# Patient Record
Sex: Female | Born: 1975 | Race: Black or African American | Hispanic: No | Marital: Married | Smoking: Never smoker
Health system: Southern US, Community
[De-identification: ages and names within clinical notes are randomized; demographics above are authoritative.]

## PROBLEM LIST (undated history)

## (undated) ENCOUNTER — Inpatient Hospital Stay (HOSPITAL_COMMUNITY): Payer: Self-pay

## (undated) DIAGNOSIS — N83209 Unspecified ovarian cyst, unspecified side: Secondary | ICD-10-CM

## (undated) DIAGNOSIS — R51 Headache: Secondary | ICD-10-CM

## (undated) DIAGNOSIS — R87619 Unspecified abnormal cytological findings in specimens from cervix uteri: Secondary | ICD-10-CM

## (undated) DIAGNOSIS — IMO0002 Reserved for concepts with insufficient information to code with codable children: Secondary | ICD-10-CM

## (undated) DIAGNOSIS — E236 Other disorders of pituitary gland: Secondary | ICD-10-CM

## (undated) HISTORY — PX: EYE SURGERY: SHX253

---

## 2010-12-31 ENCOUNTER — Other Ambulatory Visit: Payer: Self-pay | Admitting: Obstetrics and Gynecology

## 2011-01-03 ENCOUNTER — Ambulatory Visit
Admission: RE | Admit: 2011-01-03 | Discharge: 2011-01-03 | Disposition: A | Discharge status: Home / Self Care | Payer: PRIVATE HEALTH INSURANCE | Source: Ambulatory Visit | Attending: Obstetrics and Gynecology | Admitting: Obstetrics and Gynecology

## 2011-01-03 MED ORDER — GADOBENATE DIMEGLUMINE 529 MG/ML IV SOLN
7.0000 mL | Freq: Once | INTRAVENOUS | Status: AC | PRN
  Administered 2011-01-03: 7 mL via INTRAVENOUS

## 2011-06-04 NOTE — L&D Delivery Note (Signed)
Patient was C/C/+2 and pushed for 20 minutes with epidural.   NSVD female infant, Apgars 9/9, weight pending.   The patient had a second degree lacerations that extended very superficially to anus, but by digital exam confirmed no internal tearing of mucosa, repaired with 2-0 vicryl.  Also right labial tear - one figure of 8 with 2-0 vicryl. Fundus was firm. EBL was expected. Placenta was delivered intact. Vagina was clear.  Baby was vigorous to bedside.  Wendy Mercer

## 2011-10-09 LAB — OB RESULTS CONSOLE HIV ANTIBODY (ROUTINE TESTING): HIV: NONREACTIVE

## 2011-10-09 LAB — OB RESULTS CONSOLE ABO/RH: RH Type: POSITIVE

## 2011-10-09 LAB — OB RESULTS CONSOLE HEPATITIS B SURFACE ANTIGEN: Hepatitis B Surface Ag: NEGATIVE

## 2011-10-09 LAB — OB RESULTS CONSOLE RPR: RPR: NONREACTIVE

## 2012-03-29 ENCOUNTER — Inpatient Hospital Stay (HOSPITAL_COMMUNITY)
Admission: AD | Admit: 2012-03-29 | Discharge: 2012-03-29 | Disposition: A | Discharge status: Home / Self Care | Payer: PRIVATE HEALTH INSURANCE | Source: Ambulatory Visit | Attending: Obstetrics and Gynecology | Admitting: Obstetrics and Gynecology

## 2012-03-29 ENCOUNTER — Encounter (HOSPITAL_COMMUNITY): Payer: Self-pay | Admitting: *Deleted

## 2012-03-29 DIAGNOSIS — O4690 Antepartum hemorrhage, unspecified, unspecified trimester: Secondary | ICD-10-CM | POA: Insufficient documentation

## 2012-03-29 DIAGNOSIS — Z3689 Encounter for other specified antenatal screening: Secondary | ICD-10-CM

## 2012-03-29 DIAGNOSIS — Z36 Encounter for antenatal screening of mother: Secondary | ICD-10-CM

## 2012-03-29 DIAGNOSIS — O368190 Decreased fetal movements, unspecified trimester, not applicable or unspecified: Secondary | ICD-10-CM | POA: Insufficient documentation

## 2012-03-29 HISTORY — DX: Unspecified abnormal cytological findings in specimens from cervix uteri: R87.619

## 2012-03-29 HISTORY — DX: Reserved for concepts with insufficient information to code with codable children: IMO0002

## 2012-03-29 HISTORY — DX: Unspecified ovarian cyst, unspecified side: N83.209

## 2012-03-29 NOTE — MAU Note (Signed)
Pt reports she has not felt her baby move as much today as normal. Also had sme spotting when she went to the BR . Denies any pain at this time. Having some pressure that is not new (has had for the past week or 2)

## 2012-03-29 NOTE — MAU Provider Note (Signed)
History     CSN: [id number]  Arrival date & time 03/29/12  1217   None     Chief Complaint  Patient presents with  . Decreased Fetal Movement  . Vaginal Bleeding    (Consider location/radiation/quality/duration/timing/severity/associated sxs/prior treatment) HPI Wendy Mercer is a 36 y.o. G1P0 at [redacted]w[redacted]d . She noticed decrease fetal movement today, had dark spotting x 1 this am.  Has occ contractions, not a change for her. She is dressed and ready to leave, her monitor strip was good, she is reassured.  Past Medical History  Diagnosis Date  . Abnormal Pap smear   . Ovarian cyst     Past Surgical History  Procedure Date  . Eye surgery     lasix    No family history on file.  History  Substance Use Topics  . Smoking status: Never Smoker   . Smokeless tobacco: Not on file  . Alcohol Use: No    OB History    Grav Para Term Preterm Abortions TAB SAB Ect Mult Living   1               Review of Systems  Constitutional: Negative for fever and chills.  Genitourinary: Positive for vaginal bleeding. Negative for vaginal discharge and vaginal pain.    Allergies  Review of patient's allergies indicates no known allergies.  Home Medications  No current outpatient prescriptions on file.  BP 123/75  Pulse 80  Temp 98.4 F (36.9 C) (Oral)  Resp 18  Ht 5\' 4"  (1.626 m)  Wt 154 lb 6.4 oz (70.035 kg)  BMI 26.50 kg/m2  Physical Exam  Constitutional: She is oriented to person, place, and time. She appears well-developed and well-nourished.  Musculoskeletal: Normal range of motion.  Neurological: She is alert and oriented to person, place, and time.  Skin: Skin is warm and dry.  Psychiatric: She has a normal mood and affect. Her behavior is normal.    ED Course  Procedures (including critical care time)  Labs Reviewed - No data to display No results found. ASSESSMENT: Reactive strip, no regular contractions Dr Dareen Piano informed of pt status  No diagnosis  found. PLAN:  May may be discharge, kick counts Next OB visit is 11/1. Pt to call the office with other changes or concerns.    MDM

## 2012-04-17 ENCOUNTER — Encounter (HOSPITAL_COMMUNITY): Payer: Self-pay

## 2012-04-17 ENCOUNTER — Encounter (HOSPITAL_COMMUNITY): Payer: Self-pay | Admitting: Anesthesiology

## 2012-04-17 ENCOUNTER — Inpatient Hospital Stay (HOSPITAL_COMMUNITY): Payer: PRIVATE HEALTH INSURANCE | Admitting: Anesthesiology

## 2012-04-17 ENCOUNTER — Inpatient Hospital Stay (HOSPITAL_COMMUNITY)
Admission: AD | Admit: 2012-04-17 | Discharge: 2012-04-20 | DRG: 775 | Disposition: A | Discharge status: Home / Self Care | Payer: PRIVATE HEALTH INSURANCE | Source: Ambulatory Visit | Attending: Obstetrics and Gynecology | Admitting: Obstetrics and Gynecology

## 2012-04-17 LAB — CBC
MCV: 87.4 fL (ref 78.0–100.0)
Platelets: 270 10*3/uL (ref 150–400)
RBC: 4.27 MIL/uL (ref 3.87–5.11)
RDW: 12.9 % (ref 11.5–15.5)
WBC: 10.8 10*3/uL — ABNORMAL HIGH (ref 4.0–10.5)

## 2012-04-17 MED ORDER — DIPHENHYDRAMINE HCL 50 MG/ML IJ SOLN: 12.5000 mg | INTRAMUSCULAR | Status: DC | PRN

## 2012-04-17 MED ORDER — IBUPROFEN 600 MG PO TABS: 600.0000 mg | ORAL_TABLET | Freq: Four times a day (QID) | ORAL | Status: DC | PRN

## 2012-04-17 MED ORDER — FENTANYL 2.5 MCG/ML BUPIVACAINE 1/10 % EPIDURAL INFUSION (WH - ANES)
14.0000 mL/h | INTRAMUSCULAR | Status: DC
  Filled 2012-04-17: qty 125

## 2012-04-17 MED ORDER — EPHEDRINE 5 MG/ML INJ
10.0000 mg | INTRAVENOUS | Status: DC | PRN
  Administered 2012-04-17: 10 mg via INTRAVENOUS
  Filled 2012-04-17: qty 4

## 2012-04-17 MED ORDER — SODIUM BICARBONATE 8.4 % IV SOLN
INTRAVENOUS | Status: DC | PRN
  Administered 2012-04-17: 5 mL via EPIDURAL

## 2012-04-17 MED ORDER — PHENYLEPHRINE 40 MCG/ML (10ML) SYRINGE FOR IV PUSH (FOR BLOOD PRESSURE SUPPORT)
80.0000 ug | PREFILLED_SYRINGE | INTRAVENOUS | Status: DC | PRN
  Filled 2012-04-17: qty 5

## 2012-04-17 MED ORDER — EPHEDRINE 5 MG/ML INJ: 10.0000 mg | INTRAVENOUS | Status: DC | PRN

## 2012-04-17 MED ORDER — ACETAMINOPHEN 325 MG PO TABS: 650.0000 mg | ORAL_TABLET | ORAL | Status: DC | PRN

## 2012-04-17 MED ORDER — CITRIC ACID-SODIUM CITRATE 334-500 MG/5ML PO SOLN: 30.0000 mL | ORAL | Status: DC | PRN

## 2012-04-17 MED ORDER — ONDANSETRON HCL 4 MG/2ML IJ SOLN: 4.0000 mg | Freq: Four times a day (QID) | INTRAMUSCULAR | Status: DC | PRN

## 2012-04-17 MED ORDER — LACTATED RINGERS IV SOLN
INTRAVENOUS | Status: DC
  Administered 2012-04-17: 22:00:00 via INTRAVENOUS

## 2012-04-17 MED ORDER — LIDOCAINE HCL (PF) 1 % IJ SOLN
30.0000 mL | INTRAMUSCULAR | Status: DC | PRN
  Filled 2012-04-17: qty 30

## 2012-04-17 MED ORDER — OXYCODONE-ACETAMINOPHEN 5-325 MG PO TABS: 1.0000 | ORAL_TABLET | ORAL | Status: DC | PRN

## 2012-04-17 MED ORDER — OXYTOCIN BOLUS FROM INFUSION: 500.0000 mL | INTRAVENOUS | Status: DC

## 2012-04-17 MED ORDER — LACTATED RINGERS IV SOLN
500.0000 mL | Freq: Once | INTRAVENOUS | Status: AC
  Administered 2012-04-17: 500 mL via INTRAVENOUS

## 2012-04-17 MED ORDER — PHENYLEPHRINE 40 MCG/ML (10ML) SYRINGE FOR IV PUSH (FOR BLOOD PRESSURE SUPPORT): 80.0000 ug | PREFILLED_SYRINGE | INTRAVENOUS | Status: DC | PRN

## 2012-04-17 MED ORDER — LACTATED RINGERS IV SOLN
500.0000 mL | INTRAVENOUS | Status: DC | PRN
  Administered 2012-04-17: 300 mL via INTRAVENOUS

## 2012-04-17 MED ORDER — FENTANYL 2.5 MCG/ML BUPIVACAINE 1/10 % EPIDURAL INFUSION (WH - ANES)
INTRAMUSCULAR | Status: DC | PRN
  Administered 2012-04-17: 14 mL/h via EPIDURAL

## 2012-04-17 MED ORDER — OXYTOCIN 40 UNITS IN LACTATED RINGERS INFUSION - SIMPLE MED
62.5000 mL/h | INTRAVENOUS | Status: DC
  Administered 2012-04-18: 999 mL/h via INTRAVENOUS
  Filled 2012-04-17: qty 1000

## 2012-04-17 NOTE — H&P (Signed)
36 y.o. [redacted]w[redacted]d  G1P0 comes in c/o ctx, checked in office this week - 2 cm, now 5cm.  Otherwise has good fetal movement and no bleeding.  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  Past Medical History  Diagnosis Date  . Abnormal Pap smear   . Ovarian cyst     Past Surgical History  Procedure Date  . Eye surgery     lasix    OB History    Grav Para Term Preterm Abortions TAB SAB Ect Mult Living   1              # Outc Date GA Lbr Len/2nd Wgt Sex Del Anes PTL Lv   1 CUR               History   Social History  . Marital Status: Married    Spouse Name: N/A    Number of Children: N/A  . Years of Education: N/A   Occupational History  . Not on file.   Social History Main Topics  . Smoking status: Never Smoker   . Smokeless tobacco: Not on file  . Alcohol Use: No  . Drug Use: No  . Sexually Active: Yes   Other Topics Concern  . Not on file   Social History Narrative  . No narrative on file   Review of patient's allergies indicates no known allergies.    Prenatal Transfer Tool  Maternal Diabetes: No Genetic Screening: Declined Maternal Ultrasounds/Referrals: Normal Fetal Ultrasounds or other Referrals:  None Maternal Substance Abuse:  No Significant Maternal Medications:  None Significant Maternal Lab Results: None  Other PNC:    Filed Vitals:   04/17/12 2206  BP: 120/82  Pulse: 106  Temp: 98.1 F (36.7 C)  Resp: 18     Lungs/Cor:  NAD Abdomen:  soft, gravid Ex:  no cords, erythema SVE:  5/70/-2 at admission, currently 6cm FHTs:  140, good STV, NST R Toco:  q 2-3   A/P   Admit to L&D Epidural if desired AROM - blood tinged Other routine care  GBS Neg  Bertin Inabinet

## 2012-04-17 NOTE — Anesthesia Procedure Notes (Signed)

## 2012-04-17 NOTE — Anesthesia Preprocedure Evaluation (Addendum)

## 2012-04-17 NOTE — MAU Note (Signed)
Contractions all day, brownish discharge, no problems with pregnancy

## 2012-04-18 ENCOUNTER — Encounter (HOSPITAL_COMMUNITY): Payer: Self-pay | Admitting: *Deleted

## 2012-04-18 LAB — CBC
HCT: 35.4 % — ABNORMAL LOW (ref 36.0–46.0)
MCH: 31.5 pg (ref 26.0–34.0)
MCHC: 36.2 g/dL — ABNORMAL HIGH (ref 30.0–36.0)
MCV: 87.2 fL (ref 78.0–100.0)
RDW: 12.8 % (ref 11.5–15.5)

## 2012-04-18 LAB — RPR: RPR Ser Ql: NONREACTIVE

## 2012-04-18 MED ORDER — ONDANSETRON HCL 4 MG PO TABS
4.0000 mg | ORAL_TABLET | ORAL | Status: DC | PRN
Start: 2012-04-18 — End: 2012-04-20

## 2012-04-18 MED ORDER — OXYCODONE-ACETAMINOPHEN 5-325 MG PO TABS
1.0000 | ORAL_TABLET | ORAL | Status: DC | PRN
  Administered 2012-04-18 – 2012-04-20 (×5): 1 via ORAL
  Filled 2012-04-18 (×6): qty 1

## 2012-04-18 MED ORDER — PRENATAL MULTIVITAMIN CH
1.0000 | ORAL_TABLET | Freq: Every day | ORAL | Status: DC
  Administered 2012-04-18 – 2012-04-19 (×2): 1 via ORAL
  Filled 2012-04-18 (×2): qty 1

## 2012-04-18 MED ORDER — IBUPROFEN 600 MG PO TABS
600.0000 mg | ORAL_TABLET | Freq: Four times a day (QID) | ORAL | Status: DC
  Administered 2012-04-18 – 2012-04-20 (×9): 600 mg via ORAL
  Filled 2012-04-18 (×10): qty 1

## 2012-04-18 MED ORDER — ZOLPIDEM TARTRATE 5 MG PO TABS: 5.0000 mg | ORAL_TABLET | Freq: Every evening | ORAL | Status: DC | PRN

## 2012-04-18 MED ORDER — WITCH HAZEL-GLYCERIN EX PADS: 1.0000 "application " | MEDICATED_PAD | CUTANEOUS | Status: DC | PRN

## 2012-04-18 MED ORDER — SENNOSIDES-DOCUSATE SODIUM 8.6-50 MG PO TABS
2.0000 | ORAL_TABLET | Freq: Every day | ORAL | Status: DC
  Administered 2012-04-18 – 2012-04-19 (×2): 2 via ORAL

## 2012-04-18 MED ORDER — DIPHENHYDRAMINE HCL 25 MG PO CAPS: 25.0000 mg | ORAL_CAPSULE | Freq: Four times a day (QID) | ORAL | Status: DC | PRN

## 2012-04-18 MED ORDER — LANOLIN HYDROUS EX OINT: TOPICAL_OINTMENT | CUTANEOUS | Status: DC | PRN

## 2012-04-18 MED ORDER — ONDANSETRON HCL 4 MG/2ML IJ SOLN: 4.0000 mg | INTRAMUSCULAR | Status: DC | PRN

## 2012-04-18 MED ORDER — DIBUCAINE 1 % RE OINT
1.0000 "application " | TOPICAL_OINTMENT | RECTAL | Status: DC | PRN
  Administered 2012-04-20: 1 via RECTAL
  Filled 2012-04-18: qty 28

## 2012-04-18 MED ORDER — BENZOCAINE-MENTHOL 20-0.5 % EX AERO
1.0000 "application " | INHALATION_SPRAY | CUTANEOUS | Status: DC | PRN
  Administered 2012-04-18 – 2012-04-19 (×2): 1 via TOPICAL
  Filled 2012-04-18 (×2): qty 56

## 2012-04-18 MED ORDER — TETANUS-DIPHTH-ACELL PERTUSSIS 5-2.5-18.5 LF-MCG/0.5 IM SUSP: 0.5000 mL | Freq: Once | INTRAMUSCULAR | Status: DC

## 2012-04-18 MED ORDER — SIMETHICONE 80 MG PO CHEW: 80.0000 mg | CHEWABLE_TABLET | ORAL | Status: DC | PRN

## 2012-04-18 NOTE — Progress Notes (Signed)
Patient is eating, ambulating, voiding.  Pain control is good.  Filed Vitals:   04/18/12 0216 04/18/12 0235 04/18/12 0330 04/18/12 0735  BP: 129/77 112/77 129/86 113/71  Pulse: 108 106 106 90  Temp:  98.6 F (37 C) 98.6 F (37 C) 98.5 F (36.9 C)  TempSrc:  Oral Oral Oral  Resp: 18 18 18 18   Height:      Weight:        Fundus firm No CT  Lab Results  Component Value Date   WBC 14.2* 04/18/2012   HGB 12.8 04/18/2012   HCT 35.4* 04/18/2012   MCV 87.2 04/18/2012   PLT 217 04/18/2012    --/--/O POS (11/16 1610)  A/P Post partum day 0 - just after midnight.  Routine care.    Philip Aspen

## 2012-04-19 NOTE — Progress Notes (Signed)
Patient is eating, ambulating, voiding.  Pain control is good.  Appropriate lochia, no complaints.  Filed Vitals:   04/18/12 0735 04/18/12 1423 04/18/12 2156 04/19/12 0650  BP: 113/71 98/61 108/69 95/63  Pulse: 90 99 103 88  Temp: 98.5 F (36.9 C) 98.5 F (36.9 C) 98.8 F (37.1 C) 97.9 F (36.6 C)  TempSrc: Oral Oral Oral Oral  Resp: 18 18 18 18   Height:      Weight:        Fundus firm No CT  Lab Results  Component Value Date   WBC 14.2* 04/18/2012   HGB 12.8 04/18/2012   HCT 35.4* 04/18/2012   MCV 87.2 04/18/2012   PLT 217 04/18/2012    --/--/O POS (11/16 1610)  A/P Post partum day 1. Prolactinoma - f/u with endocrine  Routine care.  Expect d/c 11/18.    Philip Aspen

## 2012-04-19 NOTE — Anesthesia Postprocedure Evaluation (Signed)
  Anesthesia Post-op Note  Patient: Wendy Mercer  Procedure(s) Performed: * No procedures listed *  Patient Location: Mother/Baby  Anesthesia Type:Epidural  Level of Consciousness: awake, alert  and oriented  Airway and Oxygen Therapy: Patient Spontanous Breathing  Post-op Pain: mild  Post-op Assessment: Patient's Cardiovascular Status Stable, Respiratory Function Stable, Patent Airway, No signs of Nausea or vomiting and Pain level controlled  Post-op Vital Signs: stable  Complications: No apparent anesthesia complications

## 2012-04-20 MED ORDER — IBUPROFEN 600 MG PO TABS: 600.0000 mg | ORAL_TABLET | Freq: Four times a day (QID) | ORAL | Status: DC

## 2012-04-20 MED ORDER — OXYCODONE-ACETAMINOPHEN 5-325 MG PO TABS: 1.0000 | ORAL_TABLET | ORAL | Status: DC | PRN

## 2012-04-20 NOTE — Discharge Summary (Signed)
Obstetric Discharge Summary Reason for Admission: onset of labor Prenatal Procedures: ultrasound Intrapartum Procedures: spontaneous vaginal delivery Postpartum Procedures: none Complications-Operative and Postpartum: 2nd degree perineal laceration, right labial laceration. Hemoglobin  Date Value Range Status  04/18/2012 12.8  12.0 - 15.0 g/dL Final     HCT  Date Value Range Status  04/18/2012 35.4* 36.0 - 46.0 % Final    Physical Exam:  General: alert Lochia: appropriate Uterine Fundus: firm  Discharge Diagnoses: Term Pregnancy-delivered  Discharge Information: Date: 04/20/2012 Activity: pelvic rest Diet: routine Medications: PNV, Ibuprofen and Percocet Condition: stable Instructions: refer to practice specific booklet Discharge to: home Follow-up Information    Follow up with CALLAHAN, SIDNEY, DO. In 4 weeks.   Contact information:   [address] [place] [address] [phone]          Newborn Data: Live born female  Birth Weight: 6 lb 4.9 oz (2860 g) APGAR: 9, 9  Home with mother.  Bisma Klett D 04/20/2012, 8:36 AM

## 2012-04-22 NOTE — Progress Notes (Signed)
Post discharge chart review completed.  

## 2012-04-28 ENCOUNTER — Telehealth (HOSPITAL_COMMUNITY): Payer: Self-pay | Admitting: *Deleted

## 2012-04-28 NOTE — Telephone Encounter (Signed)
Resolve episode 

## 2012-08-25 ENCOUNTER — Encounter (HOSPITAL_COMMUNITY): Payer: Self-pay | Admitting: Obstetrics and Gynecology

## 2012-08-27 ENCOUNTER — Other Ambulatory Visit: Payer: Self-pay | Admitting: Obstetrics and Gynecology

## 2012-09-10 ENCOUNTER — Ambulatory Visit (HOSPITAL_COMMUNITY)
Admission: RE | Admit: 2012-09-10 | Payer: PRIVATE HEALTH INSURANCE | Source: Ambulatory Visit | Admitting: Obstetrics and Gynecology

## 2012-09-10 ENCOUNTER — Encounter (HOSPITAL_COMMUNITY): Admission: RE | Payer: Self-pay | Source: Ambulatory Visit

## 2012-09-10 HISTORY — DX: Headache: R51

## 2012-09-10 SURGERY — ESSURE TUBAL STERILIZATION
Anesthesia: Choice | Laterality: Bilateral

## 2013-07-14 ENCOUNTER — Encounter (HOSPITAL_COMMUNITY): Payer: Self-pay

## 2013-07-21 ENCOUNTER — Other Ambulatory Visit: Payer: Self-pay | Admitting: Obstetrics and Gynecology

## 2013-07-27 NOTE — H&P (Signed)
38 y.o. yo G1P1001 strongly desires sterilization.  Past Medical History  Diagnosis Date  . Abnormal Pap smear   . Ovarian cyst   . SVD (spontaneous vaginal delivery)     x 1  . Headache(784.0)     otc med prn  . Cyst of pituitary gland    Past Surgical History  Procedure Laterality Date  . Eye surgery       lasik    History   Social History  . Marital Status: Married    Spouse Name: N/Mercer    Number of Children: N/Mercer  . Years of Education: N/Mercer   Occupational History  . Not on file.   Social History Main Topics  . Smoking status: Never Smoker   . Smokeless tobacco: Never Used  . Alcohol Use: No  . Drug Use: No  . Sexual Activity: Yes    Birth Control/ Protection: Other-see comments     Comment: lactating   Other Topics Concern  . Not on file   Social History Narrative  . No narrative on file    No current facility-administered medications on file prior to encounter.   No current outpatient prescriptions on file prior to encounter.    No Known Allergies  @VITALS2 @  Lungs: clear to ascultation Cor:  RRR Abdomen:  soft, nontender, nondistended. Ex:  no cords, erythema Pelvic:  Normal efg, normal s/s uterus without masses.  Mercer:  Strongly desires permanent sterilization.   P:All risks, benefits and alternatives d/w patient and she desires to proceed.   Understands need for HSG three months after Essure and good birth control until tubes are confirmed closed.  Pt receiving preop and post op Depo Provera.  Wendy Mercer

## 2013-07-28 ENCOUNTER — Ambulatory Visit (HOSPITAL_COMMUNITY)
Admission: RE | Admit: 2013-07-28 | Discharge: 2013-07-28 | Disposition: A | Discharge status: Home / Self Care | Payer: PRIVATE HEALTH INSURANCE | Source: Ambulatory Visit | Attending: Obstetrics and Gynecology | Admitting: Obstetrics and Gynecology

## 2013-07-28 ENCOUNTER — Encounter (HOSPITAL_COMMUNITY)
Admission: RE | Disposition: A | Discharge status: Home / Self Care | Payer: Self-pay | Source: Ambulatory Visit | Attending: Obstetrics and Gynecology

## 2013-07-28 ENCOUNTER — Encounter (HOSPITAL_COMMUNITY): Payer: Self-pay | Admitting: Anesthesiology

## 2013-07-28 ENCOUNTER — Encounter (HOSPITAL_COMMUNITY): Payer: PRIVATE HEALTH INSURANCE | Admitting: Anesthesiology

## 2013-07-28 ENCOUNTER — Ambulatory Visit (HOSPITAL_COMMUNITY): Payer: PRIVATE HEALTH INSURANCE | Admitting: Anesthesiology

## 2013-07-28 DIAGNOSIS — IMO0002 Reserved for concepts with insufficient information to code with codable children: Secondary | ICD-10-CM | POA: Insufficient documentation

## 2013-07-28 DIAGNOSIS — Z5309 Procedure and treatment not carried out because of other contraindication: Secondary | ICD-10-CM | POA: Insufficient documentation

## 2013-07-28 DIAGNOSIS — Z9889 Other specified postprocedural states: Secondary | ICD-10-CM

## 2013-07-28 DIAGNOSIS — Q513 Bicornate uterus: Secondary | ICD-10-CM | POA: Insufficient documentation

## 2013-07-28 DIAGNOSIS — S3760XA Unspecified injury of uterus, initial encounter: Secondary | ICD-10-CM | POA: Insufficient documentation

## 2013-07-28 DIAGNOSIS — Y9210 Unspecified residential institution as the place of occurrence of the external cause: Secondary | ICD-10-CM | POA: Insufficient documentation

## 2013-07-28 HISTORY — PX: TUBAL LIGATION: SHX77

## 2013-07-28 HISTORY — DX: Other disorders of pituitary gland: E23.6

## 2013-07-28 LAB — CBC
HCT: 39.7 % (ref 36.0–46.0)
Hemoglobin: 14.1 g/dL (ref 12.0–15.0)
MCH: 29.9 pg (ref 26.0–34.0)
MCHC: 35.5 g/dL (ref 30.0–36.0)
MCV: 84.1 fL (ref 78.0–100.0)
Platelets: 250 10*3/uL (ref 150–400)
RBC: 4.72 MIL/uL (ref 3.87–5.11)
RDW: 12.7 % (ref 11.5–15.5)
WBC: 5.9 10*3/uL (ref 4.0–10.5)

## 2013-07-28 LAB — PREGNANCY, URINE: Preg Test, Ur: NEGATIVE

## 2013-07-28 SURGERY — ESSURE TUBAL STERILIZATION
Anesthesia: General | Site: Vagina | Laterality: Bilateral

## 2013-07-28 MED ORDER — STERILE WATER FOR IRRIGATION IR SOLN
Status: DC | PRN
  Administered 2013-07-28: 1000 mL

## 2013-07-28 MED ORDER — MIDAZOLAM HCL 2 MG/2ML IJ SOLN
INTRAMUSCULAR | Status: DC | PRN
  Administered 2013-07-28: 2 mg via INTRAVENOUS

## 2013-07-28 MED ORDER — PROPOFOL 10 MG/ML IV BOLUS
INTRAVENOUS | Status: DC | PRN
  Administered 2013-07-28: 200 mg via INTRAVENOUS

## 2013-07-28 MED ORDER — DOXYCYCLINE HYCLATE 100 MG PO TABS: 100.0000 mg | ORAL_TABLET | Freq: Two times a day (BID) | ORAL | Status: AC

## 2013-07-28 MED ORDER — LIDOCAINE HCL (CARDIAC) 20 MG/ML IV SOLN
INTRAVENOUS | Status: DC | PRN
  Administered 2013-07-28: 50 mg via INTRAVENOUS

## 2013-07-28 MED ORDER — FENTANYL CITRATE 0.05 MG/ML IJ SOLN
INTRAMUSCULAR | Status: DC | PRN
  Administered 2013-07-28: 50 ug via INTRAVENOUS
  Administered 2013-07-28: 100 ug via INTRAVENOUS

## 2013-07-28 MED ORDER — CEFAZOLIN SODIUM-DEXTROSE 2-3 GM-% IV SOLR
INTRAVENOUS | Status: DC | PRN
  Administered 2013-07-28: 2 g via INTRAVENOUS

## 2013-07-28 MED ORDER — KETOROLAC TROMETHAMINE 30 MG/ML IJ SOLN
INTRAMUSCULAR | Status: DC | PRN
  Administered 2013-07-28: 30 mg via INTRAVENOUS

## 2013-07-28 MED ORDER — LACTATED RINGERS IV SOLN
INTRAVENOUS | Status: DC
  Administered 2013-07-28: 10:00:00 via INTRAVENOUS

## 2013-07-28 MED ORDER — FENTANYL CITRATE 0.05 MG/ML IJ SOLN: 25.0000 ug | INTRAMUSCULAR | Status: DC | PRN

## 2013-07-28 MED ORDER — ONDANSETRON HCL 4 MG/2ML IJ SOLN
INTRAMUSCULAR | Status: DC | PRN
  Administered 2013-07-28: 4 mg via INTRAVENOUS

## 2013-07-28 MED ORDER — DEXAMETHASONE SODIUM PHOSPHATE 4 MG/ML IJ SOLN
INTRAMUSCULAR | Status: DC | PRN
  Administered 2013-07-28: 10 mg via INTRAVENOUS

## 2013-07-28 SURGICAL SUPPLY — 14 items
CATH ROBINSON RED A/P 16FR (CATHETERS) ×3 IMPLANT
CLOTH BEACON ORANGE TIMEOUT ST (SAFETY) ×3 IMPLANT
CONTAINER PREFILL 10% NBF 60ML (FORM) IMPLANT
GLOVE BIO SURGEON STRL SZ7 (GLOVE) ×3 IMPLANT
GLOVE BIOGEL PI IND STRL 7.0 (GLOVE) ×1 IMPLANT
GLOVE BIOGEL PI INDICATOR 7.0 (GLOVE) ×2
GOWN STRL REUS W/TWL LRG LVL3 (GOWN DISPOSABLE) ×3 IMPLANT
GOWN STRL REUS W/TWL XL LVL3 (GOWN DISPOSABLE) ×3 IMPLANT
KIT ESSURE FALLOPIAN CLOSURE (Ring) IMPLANT
PACK VAGINAL MINOR WOMEN LF (CUSTOM PROCEDURE TRAY) ×3 IMPLANT
SUT VIC AB 2-0 CT1 27 (SUTURE) ×2
SUT VIC AB 2-0 CT1 TAPERPNT 27 (SUTURE) ×1 IMPLANT
TOWEL OR 17X24 6PK STRL BLUE (TOWEL DISPOSABLE) ×6 IMPLANT
WATER STERILE IRR 1000ML POUR (IV SOLUTION) ×3 IMPLANT

## 2013-07-28 NOTE — Op Note (Signed)
07/28/2013  11:09 AM  PATIENT:  Wendy Mercer  38 y.o. female  PRE-OPERATIVE DIAGNOSIS:  DESIRES STERILIZATION  POST-OPERATIVE DIAGNOSIS:  DESIRES STERILIZATION  PROCEDURE:  Hysteroscopy, unable to do Essure  SURGEON:  Surgeon(s) and Role:    * Loney LaurenceMichelle A Jaime Dome, MD - Primary  PHYSICIAN ASSISTANT:   ASSISTANTS: Essure representative.   ANESTHESIA:   general  EBL:  Total I/O In: -  Out: 75 [Urine:75]  EBL min  BLOOD ADMINISTERED:none  DISPOSITION OF SPECIMEN:  PATHOLOGY  COUNTS:  YES  TOURNIQUET:  * No tourniquets in log *  DICTATION: .Note written in EPIC  PLAN OF CARE: Discharge to home after PACU  PATIENT DISPOSITION:  PACU - hemodynamically stable.   Delay start of Pharmacological VTE agent (>24hrs) due to surgical blood loss or risk of bleeding: not applicable  Complications: Perforation of L cornua- stable and not bleeding.  Hysteroscopic deficit about 1080 cc.    Meds: none  Findings: Bicornuate uterus with shallow septum.  L ostia was not evident immediately and needed to be cleared.  Once cleared, the ostia was so lateral and anterior that the Essure instrument was unable to be introduced.  At one point the Essure instrument seemed to cannulate the ostia by on advancing it perforated the cornua.  No active bleeding was seen after perforation.  Normal cavity was seen otherwise.  The right ostia was obvious but procedure was aborted.  Small tear of cervix repaired with 2-) vicryl.  Technique:  After adequate anesthesia was achieved, the patient was prepped and draped in the usual sterile fashion.  The speculum was placed in the vagina and the cervix stabilized with a single-tooth tenaculum.  The cervix was dilated with pratt dilators and the hysteroscope passed inside the endometrial cavity.  The above findings were noted and polyp forceps were used to remove small pieces of tissue through scope to be able to see L ostia.  The Essure was attempted to be  introduced into the L ostia as above but the procedure was abandoned after perforation.  A small tear from the tenaculum site was repaired with 2- vicryl in a figure of eight stitch.  All instruments were removed from the vagina.  The patient tolerated the procedure well.    Deboraha Goar A

## 2013-07-28 NOTE — Transfer of Care (Signed)
Immediate Anesthesia Transfer of Care Note  Patient: Wendy Mercer  Procedure(s) Performed: Procedure(s): HYSTEROSCOPY/ ATTEMPTED ESSURE TUBAL STERILIZATION (Bilateral)  Patient Location: PACU  Anesthesia Type:General  Level of Consciousness: awake, alert , oriented and patient cooperative  Airway & Oxygen Therapy: Patient Spontanous Breathing and Patient connected to nasal cannula oxygen  Post-op Assessment: Report given to PACU RN and Post -op Vital signs reviewed and stable  Post vital signs: Reviewed and stable  Complications: No apparent anesthesia complications

## 2013-07-28 NOTE — Progress Notes (Signed)
There has been no change in the patients history, status or exam since the history and physical.  There were no vitals filed for this visit.  Lab Results  Component Value Date   WBC 5.9 07/28/2013   HGB 14.1 07/28/2013   HCT 39.7 07/28/2013   MCV 84.1 07/28/2013   PLT 250 07/28/2013    Wendy Mercer A

## 2013-07-28 NOTE — Discharge Instructions (Signed)
DISCHARGE INSTRUCTIONS: HYSTEROSCOPY / ENDOMETRIAL ABLATION The following instructions have been prepared to help you care for yourself upon your return home.  Personal hygiene:  Use sanitary pads for vaginal drainage, not tampons.  Shower the day after your procedure.  NO tub baths, pools or Jacuzzis for 2-3 weeks.  Wipe front to back after using the bathroom.  Activity and limitations:  Do NOT drive or operate any equipment for 24 hours. The effects of anesthesia are still present and drowsiness may result.  Do NOT rest in bed all day.  Walking is encouraged.  Walk up and down stairs slowly.  You may resume your normal activity in one to two days or as indicated by your physician. Sexual activity: NO intercourse for at least 2 weeks after the procedure, or as indicated by your Doctor.  Diet: Eat a light meal as desired this evening. You may resume your usual diet tomorrow.  Return to Work: You may resume your work activities in one to two days or as indicated by Therapist, sportsyour Doctor.  What to expect after your surgery: Expect to have vaginal bleeding/discharge for 2-3 days and spotting for up to 10 days. It is not unusual to have soreness for up to 1-2 weeks. You may have a slight burning sensation when you urinate for the first day. Mild cramps may continue for a couple of days. You may have a regular period in 2-6 weeks.  NO IBUPROFEN PRODUCTS (MOTRIN, ADVIL) OR ALEVE UNTIL 5:00PM TODAY.   Call your doctor for any of the following:  Excessive vaginal bleeding or clotting, saturating and changing one pad every hour.  Inability to urinate 6 hours after discharge from hospital.  Pain not relieved by pain medication.  Fever of 100.4 F or greater.  Unusual vaginal discharge or odor.  Return to office _________________Call for an appointment ___________________ Patients signature: ______________________ Nurses signature ________________________  Post Anesthesia Care  Unit [phone]

## 2013-07-28 NOTE — Brief Op Note (Signed)
07/28/2013  11:09 AM  PATIENT:  Wendy Mercer  38 y.o. female  PRE-OPERATIVE DIAGNOSIS:  DESIRES STERILIZATION  POST-OPERATIVE DIAGNOSIS:  DESIRES STERILIZATION  PROCEDURE:  Hysteroscopy, unable to do Essure  SURGEON:  Surgeon(s) and Role:    * Loney LaurenceMichelle A Yer Olivencia, MD - Primary  PHYSICIAN ASSISTANT:   ASSISTANTS: Essure representative.   ANESTHESIA:   general  EBL:  Total I/O In: -  Out: 75 [Urine:75]  BLOOD ADMINISTERED:none  DISPOSITION OF SPECIMEN:  PATHOLOGY  COUNTS:  YES  TOURNIQUET:  * No tourniquets in log *  DICTATION: .Note written in EPIC  PLAN OF CARE: Discharge to home after PACU  PATIENT DISPOSITION:  PACU - hemodynamically stable.   Delay start of Pharmacological VTE agent (>24hrs) due to surgical blood loss or risk of bleeding: not applicable  Complications: Perforation of L cornua- stable and not bleeding.  Hysteroscopic deficit about 1080 cc.    Meds: none  Findings: Bicornuate uterus with shallow septum.  L ostia was not evident immediately and needed to be cleared.  Once cleared, the ostia was so lateral and anterior that the Essure instrument was unable to be introduced.  At one point the Essure instrument seemed to cannulate the ostia by on advancing it perforated the cornua.  No active bleeding was seen after perforation.  Normal cavity was seen otherwise.  The right ostia was obvious but procedure was aborted.  Small tear of cervix repaired with 2-) vicryl.   After adequate anesthesia was achieved, the patient was prepped and draped in the usual sterile fashion.  The speculum was placed in the vagina and the cervix stabilized with a single-tooth tenaculum.  The cervix was dilated with pratt dilators and the hysteroscope passed inside the endometrial cavity.  The above findings were noted and polyp forceps were used to remove small pieces of tissue through scope to be able to see L ostia.  The Essure was attempted to be introduced into the L ostia  as above but the procedure was abandoned after perforation.  A small tear from the tenaculum site was repaired with 2- vicryl in a figure of eight stitch.  All instruments were removed from the vagina.  The patient tolerated the procedure well.    [name] A

## 2013-07-28 NOTE — Anesthesia Preprocedure Evaluation (Addendum)

## 2013-07-29 ENCOUNTER — Encounter (HOSPITAL_COMMUNITY): Payer: Self-pay | Admitting: Obstetrics and Gynecology

## 2013-07-29 NOTE — Anesthesia Postprocedure Evaluation (Signed)
  Anesthesia Post-op Note  Patient: Wendy CarwinRasheda Batta  Procedure(s) Performed: Procedure(s): HYSTEROSCOPY/ ATTEMPTED ESSURE TUBAL STERILIZATION (Bilateral) Patient is awake and responsive. Pain and nausea are reasonably well controlled. Vital signs are stable and clinically acceptable. Oxygen saturation is clinically acceptable. There are no apparent anesthetic complications at this time. Patient is ready for discharge.

## 2013-09-10 ENCOUNTER — Encounter (HOSPITAL_COMMUNITY): Payer: Self-pay | Admitting: Pharmacist

## 2013-09-21 ENCOUNTER — Other Ambulatory Visit: Payer: Self-pay | Admitting: Obstetrics and Gynecology

## 2013-09-21 NOTE — H&P (Signed)
Please see previous H&P below:  Pt was unable to have essure placed secondary an irregularly shaped tubal ostia.  Pt still desires permanent sterilization and desires laparoscopic sterilization.  No other change in H&P.  H&P by Loney LaurenceMichelle A Zaharah Amir, MD at 07/27/2013  9:05 AM    Previously:  Author: Loney LaurenceMichelle A Marcey Persad, MD Service: Obstetrics Author Type: Physician    Filed: 07/27/2013  9:14 AM Note Time: 07/27/2013  9:05 AM Status: Signed    Editor: Loney LaurenceMichelle A Lalonnie Shaffer, MD (Physician)          38 y.o. yo [phone] strongly desires sterilization.    Past Medical History   Diagnosis  Date   .  Abnormal Pap smear     .  Ovarian cyst     .  SVD (spontaneous vaginal delivery)         x 1   .  Headache(784.0)         otc med prn   .  Cyst of pituitary gland         Past Surgical History   Procedure  Laterality  Date   .  Eye surgery            lasik       History       Social History   .  Marital Status:  Married       Spouse Name:  N/A       Number of Children:  N/A   .  Years of Education:  N/A       Occupational History   .  Not on file.       Social History Main Topics   .  Smoking status:  Never Smoker    .  Smokeless tobacco:  Never Used   .  Alcohol Use:  No   .  Drug Use:  No   .  Sexual Activity:  Yes       Birth Control/ Protection:  Other-see comments         Comment: lactating       Other Topics  Concern   .  Not on file       Social History Narrative   .  No narrative on file         No current facility-administered medications on file prior to encounter.       No current outpatient prescriptions on file prior to encounter.        No Known Allergies   @VITALS2 @   Lungs: clear to ascultation Cor:  RRR Abdomen:  soft, nontender, nondistended. Ex:  no cords, erythema Pelvic:  Normal efg, normal s/s uterus without masses.   A:  Strongly desires permanent sterilization.     P:All risks, benefits and alternatives d/w patient and she  desires to proceed.   Understands need for HSG three months after Essure and good birth control until tubes are confirmed closed.  Pt receiving preop and post op Depo Provera.   Rakeb Kibble A

## 2013-09-22 ENCOUNTER — Ambulatory Visit (HOSPITAL_COMMUNITY): Payer: PRIVATE HEALTH INSURANCE | Admitting: Anesthesiology

## 2013-09-22 ENCOUNTER — Encounter (HOSPITAL_COMMUNITY): Payer: Self-pay | Admitting: Anesthesiology

## 2013-09-22 ENCOUNTER — Encounter (HOSPITAL_COMMUNITY): Payer: PRIVATE HEALTH INSURANCE | Admitting: Anesthesiology

## 2013-09-22 ENCOUNTER — Ambulatory Visit (HOSPITAL_COMMUNITY)
Admission: RE | Admit: 2013-09-22 | Discharge: 2013-09-22 | Disposition: A | Discharge status: Home / Self Care | Payer: PRIVATE HEALTH INSURANCE | Source: Ambulatory Visit | Attending: Obstetrics and Gynecology | Admitting: Obstetrics and Gynecology

## 2013-09-22 ENCOUNTER — Encounter (HOSPITAL_COMMUNITY)
Admission: RE | Disposition: A | Discharge status: Home / Self Care | Payer: Self-pay | Source: Ambulatory Visit | Attending: Obstetrics and Gynecology

## 2013-09-22 DIAGNOSIS — Z9889 Other specified postprocedural states: Secondary | ICD-10-CM

## 2013-09-22 DIAGNOSIS — Z302 Encounter for sterilization: Secondary | ICD-10-CM | POA: Insufficient documentation

## 2013-09-22 HISTORY — PX: LAPAROSCOPIC TUBAL LIGATION: SHX1937

## 2013-09-22 LAB — CBC
HCT: 40.1 % (ref 36.0–46.0)
HEMOGLOBIN: 14 g/dL (ref 12.0–15.0)
MCH: 30 pg (ref 26.0–34.0)
MCHC: 34.9 g/dL (ref 30.0–36.0)
MCV: 86.1 fL (ref 78.0–100.0)
Platelets: 243 10*3/uL (ref 150–400)
RBC: 4.66 MIL/uL (ref 3.87–5.11)
RDW: 12.7 % (ref 11.5–15.5)
WBC: 7.5 10*3/uL (ref 4.0–10.5)

## 2013-09-22 LAB — PREGNANCY, URINE: Preg Test, Ur: NEGATIVE

## 2013-09-22 SURGERY — LIGATION, FALLOPIAN TUBE, LAPAROSCOPIC
Anesthesia: General | Site: Abdomen | Laterality: Bilateral

## 2013-09-22 MED ORDER — GLYCOPYRROLATE 0.2 MG/ML IJ SOLN
INTRAMUSCULAR | Status: AC
  Filled 2013-09-22: qty 3

## 2013-09-22 MED ORDER — NEOSTIGMINE METHYLSULFATE 1 MG/ML IJ SOLN
INTRAMUSCULAR | Status: DC | PRN
  Administered 2013-09-22: 3 mg via INTRAVENOUS

## 2013-09-22 MED ORDER — MIDAZOLAM HCL 2 MG/2ML IJ SOLN
INTRAMUSCULAR | Status: DC | PRN
  Administered 2013-09-22: 2 mg via INTRAVENOUS

## 2013-09-22 MED ORDER — DEXAMETHASONE SODIUM PHOSPHATE 10 MG/ML IJ SOLN
INTRAMUSCULAR | Status: DC | PRN
  Administered 2013-09-22: 10 mg via INTRAVENOUS

## 2013-09-22 MED ORDER — LIDOCAINE HCL (CARDIAC) 20 MG/ML IV SOLN
INTRAVENOUS | Status: DC | PRN
  Administered 2013-09-22: 50 mg via INTRAVENOUS

## 2013-09-22 MED ORDER — MIDAZOLAM HCL 2 MG/2ML IJ SOLN
INTRAMUSCULAR | Status: AC
  Filled 2013-09-22: qty 2

## 2013-09-22 MED ORDER — ESMOLOL HCL 10 MG/ML IV SOLN
INTRAVENOUS | Status: DC | PRN
  Administered 2013-09-22: 100 mg via INTRAVENOUS

## 2013-09-22 MED ORDER — LACTATED RINGERS IV SOLN
INTRAVENOUS | Status: DC
  Administered 2013-09-22 (×2): via INTRAVENOUS

## 2013-09-22 MED ORDER — SODIUM CHLORIDE 0.9 % IJ SOLN
INTRAMUSCULAR | Status: AC
  Filled 2013-09-22: qty 3

## 2013-09-22 MED ORDER — OXYCODONE-ACETAMINOPHEN 5-325 MG PO TABS: 1.0000 | ORAL_TABLET | ORAL | Status: DC | PRN

## 2013-09-22 MED ORDER — KETOROLAC TROMETHAMINE 30 MG/ML IJ SOLN
INTRAMUSCULAR | Status: DC | PRN
  Administered 2013-09-22: 30 mg via INTRAVENOUS

## 2013-09-22 MED ORDER — KETOROLAC TROMETHAMINE 30 MG/ML IJ SOLN
INTRAMUSCULAR | Status: AC
  Filled 2013-09-22: qty 1

## 2013-09-22 MED ORDER — PROPOFOL 10 MG/ML IV EMUL
INTRAVENOUS | Status: AC
  Filled 2013-09-22: qty 20

## 2013-09-22 MED ORDER — KETOROLAC TROMETHAMINE 30 MG/ML IJ SOLN: 15.0000 mg | Freq: Once | INTRAMUSCULAR | Status: DC | PRN

## 2013-09-22 MED ORDER — ONDANSETRON HCL 4 MG/2ML IJ SOLN
INTRAMUSCULAR | Status: AC
  Filled 2013-09-22: qty 2

## 2013-09-22 MED ORDER — MEPERIDINE HCL 25 MG/ML IJ SOLN: 6.2500 mg | INTRAMUSCULAR | Status: DC | PRN

## 2013-09-22 MED ORDER — LIDOCAINE HCL (CARDIAC) 20 MG/ML IV SOLN
INTRAVENOUS | Status: AC
  Filled 2013-09-22: qty 5

## 2013-09-22 MED ORDER — GLYCOPYRROLATE 0.2 MG/ML IJ SOLN
INTRAMUSCULAR | Status: DC | PRN
  Administered 2013-09-22: 0.6 mg via INTRAVENOUS

## 2013-09-22 MED ORDER — OXYCODONE-ACETAMINOPHEN 5-325 MG PO TABS
ORAL_TABLET | ORAL | Status: AC
  Filled 2013-09-22: qty 1

## 2013-09-22 MED ORDER — SILVER NITRATE-POT NITRATE 75-25 % EX MISC
CUTANEOUS | Status: DC | PRN
  Administered 2013-09-22: 2 via TOPICAL

## 2013-09-22 MED ORDER — ROCURONIUM BROMIDE 100 MG/10ML IV SOLN
INTRAVENOUS | Status: DC | PRN
  Administered 2013-09-22: 20 mg via INTRAVENOUS
  Administered 2013-09-22: 10 mg via INTRAVENOUS

## 2013-09-22 MED ORDER — DEXAMETHASONE SODIUM PHOSPHATE 10 MG/ML IJ SOLN
INTRAMUSCULAR | Status: AC
  Filled 2013-09-22: qty 1

## 2013-09-22 MED ORDER — FENTANYL CITRATE 0.05 MG/ML IJ SOLN
25.0000 ug | INTRAMUSCULAR | Status: DC | PRN
Start: 2013-09-22 — End: 2013-09-22
  Administered 2013-09-22 (×2): 50 ug via INTRAVENOUS

## 2013-09-22 MED ORDER — ESMOLOL HCL 10 MG/ML IV SOLN
INTRAVENOUS | Status: AC
  Filled 2013-09-22: qty 10

## 2013-09-22 MED ORDER — ONDANSETRON HCL 4 MG/2ML IJ SOLN
4.0000 mg | Freq: Once | INTRAMUSCULAR | Status: AC | PRN
  Administered 2013-09-22: 4 mg via INTRAVENOUS

## 2013-09-22 MED ORDER — PROPOFOL 10 MG/ML IV BOLUS
INTRAVENOUS | Status: DC | PRN
  Administered 2013-09-22: 180 mg via INTRAVENOUS
  Administered 2013-09-22: 20 mg via INTRAVENOUS
  Administered 2013-09-22: 50 mg via INTRAVENOUS

## 2013-09-22 MED ORDER — FENTANYL CITRATE 0.05 MG/ML IJ SOLN
INTRAMUSCULAR | Status: DC | PRN
  Administered 2013-09-22 (×2): 50 ug via INTRAVENOUS
  Administered 2013-09-22: 100 ug via INTRAVENOUS
  Administered 2013-09-22: 50 ug via INTRAVENOUS

## 2013-09-22 MED ORDER — FENTANYL CITRATE 0.05 MG/ML IJ SOLN
INTRAMUSCULAR | Status: AC
  Administered 2013-09-22: 50 ug via INTRAVENOUS
  Filled 2013-09-22: qty 2

## 2013-09-22 MED ORDER — NEOSTIGMINE METHYLSULFATE 1 MG/ML IJ SOLN
INTRAMUSCULAR | Status: AC
  Filled 2013-09-22: qty 1

## 2013-09-22 MED ORDER — ONDANSETRON HCL 4 MG/2ML IJ SOLN
INTRAMUSCULAR | Status: DC | PRN
  Administered 2013-09-22: 4 mg via INTRAVENOUS

## 2013-09-22 MED ORDER — OXYCODONE-ACETAMINOPHEN 5-325 MG PO TABS
1.0000 | ORAL_TABLET | ORAL | Status: DC | PRN
  Administered 2013-09-22: 1 via ORAL

## 2013-09-22 MED ORDER — ROCURONIUM BROMIDE 100 MG/10ML IV SOLN
INTRAVENOUS | Status: AC
  Filled 2013-09-22: qty 1

## 2013-09-22 MED ORDER — FENTANYL CITRATE 0.05 MG/ML IJ SOLN
INTRAMUSCULAR | Status: AC
  Filled 2013-09-22: qty 5

## 2013-09-22 SURGICAL SUPPLY — 17 items
CATH ROBINSON RED A/P 16FR (CATHETERS) ×3 IMPLANT
CHLORAPREP W/TINT 26ML (MISCELLANEOUS) ×3 IMPLANT
CLIP FILSHIE TUBAL LIGA STRL (Clip) ×3 IMPLANT
CLOTH BEACON ORANGE TIMEOUT ST (SAFETY) ×3 IMPLANT
DERMABOND ADVANCED (GAUZE/BANDAGES/DRESSINGS) ×2
DERMABOND ADVANCED .7 DNX12 (GAUZE/BANDAGES/DRESSINGS) ×1 IMPLANT
GLOVE BIO SURGEON STRL SZ7 (GLOVE) ×3 IMPLANT
GOWN STRL REUS W/TWL LRG LVL3 (GOWN DISPOSABLE) ×6 IMPLANT
NEEDLE INSUFFLATION 120MM (ENDOMECHANICALS) ×3 IMPLANT
PACK LAPAROSCOPY BASIN (CUSTOM PROCEDURE TRAY) ×3 IMPLANT
SUT VIC AB 2-0 UR5 27 (SUTURE) ×3 IMPLANT
SUT VICRYL RAPIDE 3 0 (SUTURE) ×3 IMPLANT
TOWEL OR 17X24 6PK STRL BLUE (TOWEL DISPOSABLE) ×6 IMPLANT
TROCAR XCEL DIL TIP R 11M (ENDOMECHANICALS) ×3 IMPLANT
TROCAR XCEL NON-BLD 5MMX100MML (ENDOMECHANICALS) IMPLANT
WARMER LAPAROSCOPE (MISCELLANEOUS) ×3 IMPLANT
WATER STERILE IRR 1000ML POUR (IV SOLUTION) ×3 IMPLANT

## 2013-09-22 NOTE — Transfer of Care (Signed)
Immediate Anesthesia Transfer of Care Note  Patient: Wendy CarwinRasheda Mercer  Procedure(s) Performed: Procedure(s): BILATERAL LAPAROSCOPIC TUBAL LIGATION (Bilateral)  Patient Location: PACU  Anesthesia Type:General  Level of Consciousness: awake  Airway & Oxygen Therapy: Patient Spontanous Breathing  Post-op Assessment: Report given to PACU RN  Post vital signs: stable  Filed Vitals:   09/22/13 0807  BP: 105/77  Pulse: 90  Temp: 36.8 C  Resp: 18    Complications: No apparent anesthesia complications

## 2013-09-22 NOTE — Anesthesia Postprocedure Evaluation (Signed)
Anesthesia Post Note  Patient: Wendy CarwinRasheda Mercer  Procedure(s) Performed: Procedure(s) (LRB): BILATERAL LAPAROSCOPIC TUBAL LIGATION (Bilateral)  Anesthesia type: General  Patient location: PACU  Post pain: Pain level controlled  Post assessment: Post-op Vital signs reviewed  Last Vitals:  Filed Vitals:   09/22/13 1030  BP: 136/102  Pulse: 67  Temp:   Resp: 11    Post vital signs: Reviewed  Level of consciousness: sedated  Complications: No apparent anesthesia complications

## 2013-09-22 NOTE — Brief Op Note (Signed)
09/22/2013  9:38 AM  PATIENT:  Wendy Mercer  38 y.o. female  PRE-OPERATIVE DIAGNOSIS:  DESIRES STERILIZATION  POST-OPERATIVE DIAGNOSIS:  DESIRES STERILIZATION  PROCEDURE:  Procedure(s): BILATERAL LAPAROSCOPIC TUBAL LIGATION (Bilateral)  SURGEON:  Surgeon(s) and Role:    * Loney LaurenceMichelle A Felicite Zeimet, MD - Primary  ANESTHESIA:   general  EBL:  Total I/O In: 1000 [I.V.:1000] Out: 60 [Urine:50; Blood:10]  SPECIMEN:  No Specimen  DISPOSITION OF SPECIMEN:  N/A  COUNTS:  YES  TOURNIQUET:  * No tourniquets in log *  DICTATION: .Note written in EPIC  PLAN OF CARE: Admit to inpatient   PATIENT DISPOSITION:  PACU - hemodynamically stable.   Delay start of Pharmacological VTE agent (>24hrs) due to surgical blood loss or risk of bleeding: yes  Meds: none  Complications:  none  Findings: normal uterus, tubes and ovaries.  Small healing area on L cornua of uterus consistent with prior perforation.    Technique  After adequate general endotracheal anesthesia was achieved, the patient was prepped and draped in the usual sterile fashion.  A 2 cm incision was made just below the umbilicus and the abdominal wall tented up.  The Veress needle was inserted at a 45 degree angle to the pelvis and no bowel contents or blood were aspirated.  The abdomen was insufflated and the 12 mm trocar placed without complication.  The operative scope was introduced and the above findings noted.  The filchie clip instrument was introduced and the tube were followed to their fimbriated ends bilaterally.  A filchie clip was placed on the isthmic portion of each tube and confirmed visually to be around the entire circumference of each tube.  After a quick inspection of the pelvis and liver edge, all instruments were removed and the abdomen was desufflated.  The 12 mm faschial incision was closed with a figure of eight stitch of 2-vicryl and the skin was closed with dermabond.  All instruments were removed from the  vagina and the patient returned to the PACU in stable condition.  Vedant Shehadeh A

## 2013-09-22 NOTE — Progress Notes (Signed)
There has been no change in the patients history, status or exam since the history and physical.  Filed Vitals:   09/22/13 0807  BP: 105/77  Pulse: 90  Temp: 98.2 F (36.8 C)  TempSrc: Oral  Resp: 18  Height: 5\' 5"  (1.651 m)  Weight: 59.875 kg (132 lb)  SpO2: 99%    Lab Results  Component Value Date   WBC 7.5 09/22/2013   HGB 14.0 09/22/2013   HCT 40.1 09/22/2013   MCV 86.1 09/22/2013   PLT 243 09/22/2013    Loney LaurenceMichelle A Deleon Passe

## 2013-09-22 NOTE — Discharge Instructions (Signed)

## 2013-09-22 NOTE — Op Note (Signed)
09/22/2013  9:38 AM  PATIENT:  Wendy Mercer  38 y.o. female  PRE-OPERATIVE DIAGNOSIS:  DESIRES STERILIZATION  POST-OPERATIVE DIAGNOSIS:  DESIRES STERILIZATION  PROCEDURE:  Procedure(s): BILATERAL LAPAROSCOPIC TUBAL LIGATION (Bilateral)  SURGEON:  Surgeon(s) and Role:    * Tishia Maestre A Swade Shonka, MD - Primary  ANESTHESIA:   general  EBL:  Total I/O In: 1000 [I.V.:1000] Out: 60 [Urine:50; Blood:10]  SPECIMEN:  No Specimen  DISPOSITION OF SPECIMEN:  N/A  COUNTS:  YES  TOURNIQUET:  * No tourniquets in log *  DICTATION: .Note written in EPIC  PLAN OF CARE: Admit to inpatient   PATIENT DISPOSITION:  PACU - hemodynamically stable.   Delay start of Pharmacological VTE agent (>24hrs) due to surgical blood loss or risk of bleeding: yes  Meds: none  Complications:  none  Findings: normal uterus, tubes and ovaries.  Small healing area on L cornua of uterus consistent with prior perforation.    Technique  After adequate general endotracheal anesthesia was achieved, the patient was prepped and draped in the usual sterile fashion.  A 2 cm incision was made just below the umbilicus and the abdominal wall tented up.  The Veress needle was inserted at a 45 degree angle to the pelvis and no bowel contents or blood were aspirated.  The abdomen was insufflated and the 12 mm trocar placed without complication.  The operative scope was introduced and the above findings noted.  The filchie clip instrument was introduced and the tube were followed to their fimbriated ends bilaterally.  A filchie clip was placed on the isthmic portion of each tube and confirmed visually to be around the entire circumference of each tube.  After a quick inspection of the pelvis and liver edge, all instruments were removed and the abdomen was desufflated.  The 12 mm faschial incision was closed with a figure of eight stitch of 2-vicryl and the skin was closed with dermabond.  All instruments were removed from the  vagina and the patient returned to the PACU in stable condition.  Siddharth Babington A     

## 2013-09-22 NOTE — Anesthesia Preprocedure Evaluation (Signed)
Anesthesia Evaluation  Patient identified by MRN, date of birth, ID band Patient awake    Reviewed: Allergy & Precautions, H&P , NPO status , Patient's Chart, lab work & pertinent test results  Airway Mallampati: I TM Distance: >3 FB Neck ROM: full    Dental no notable dental hx. (+) Teeth Intact   Pulmonary neg pulmonary ROS,    Pulmonary exam normal       Cardiovascular negative cardio ROS      Neuro/Psych negative psych ROS   GI/Hepatic negative GI ROS, Neg liver ROS,   Endo/Other  negative endocrine ROS  Renal/GU negative Renal ROS     Musculoskeletal   Abdominal Normal abdominal exam  (+)   Peds  Hematology negative hematology ROS (+)   Anesthesia Other Findings   Reproductive/Obstetrics negative OB ROS                           Anesthesia Physical Anesthesia Plan  ASA: II  Anesthesia Plan: General   Post-op Pain Management:    Induction: Intravenous  Airway Management Planned: Oral ETT  Additional Equipment:   Intra-op Plan:   Post-operative Plan: Extubation in OR  Informed Consent: I have reviewed the patients History and Physical, chart, labs and discussed the procedure including the risks, benefits and alternatives for the proposed anesthesia with the patient or authorized representative who has indicated his/her understanding and acceptance.   Dental Advisory Given  Plan Discussed with: CRNA and Surgeon  Anesthesia Plan Comments:         Anesthesia Quick Evaluation  

## 2013-09-23 ENCOUNTER — Encounter (HOSPITAL_COMMUNITY): Payer: Self-pay | Admitting: Obstetrics and Gynecology

## 2014-04-04 ENCOUNTER — Encounter (HOSPITAL_COMMUNITY): Payer: Self-pay | Admitting: Obstetrics and Gynecology

## 2014-04-26 ENCOUNTER — Ambulatory Visit: Payer: Self-pay | Admitting: Surgery

## 2014-05-20 ENCOUNTER — Ambulatory Visit: Payer: Self-pay | Admitting: Surgery

## 2015-03-22 ENCOUNTER — Encounter: Payer: Self-pay | Admitting: Internal Medicine

## 2015-03-22 DIAGNOSIS — Q8789 Other specified congenital malformation syndromes, not elsewhere classified: Secondary | ICD-10-CM

## 2015-03-22 DIAGNOSIS — E230 Hypopituitarism: Secondary | ICD-10-CM | POA: Insufficient documentation

## 2015-03-22 DIAGNOSIS — N643 Galactorrhea not associated with childbirth: Secondary | ICD-10-CM

## 2015-03-22 DIAGNOSIS — N911 Secondary amenorrhea: Secondary | ICD-10-CM

## 2015-06-15 ENCOUNTER — Other Ambulatory Visit: Payer: Self-pay

## 2015-08-29 ENCOUNTER — Encounter: Payer: Self-pay | Admitting: Internal Medicine

## 2015-08-29 ENCOUNTER — Ambulatory Visit (INDEPENDENT_AMBULATORY_CARE_PROVIDER_SITE_OTHER): Payer: Managed Care, Other (non HMO) | Admitting: Internal Medicine

## 2015-08-29 VITALS — BP 110/62 | HR 64 | Ht 65.0 in | Wt 136.6 lb

## 2015-08-29 DIAGNOSIS — B009 Herpesviral infection, unspecified: Secondary | ICD-10-CM | POA: Diagnosis not present

## 2015-08-29 DIAGNOSIS — L700 Acne vulgaris: Secondary | ICD-10-CM | POA: Diagnosis not present

## 2015-08-29 DIAGNOSIS — E221 Hyperprolactinemia: Secondary | ICD-10-CM | POA: Diagnosis not present

## 2015-08-29 DIAGNOSIS — L709 Acne, unspecified: Secondary | ICD-10-CM | POA: Insufficient documentation

## 2015-08-29 MED ORDER — VALACYCLOVIR HCL 1 G PO TABS: 1000.0000 mg | ORAL_TABLET | Freq: Every day | ORAL | Status: DC

## 2015-08-29 NOTE — Patient Instructions (Signed)
Breast Self-Awareness Practicing breast self-awareness may pick up problems early, prevent significant medical complications, and possibly save your life. By practicing breast self-awareness, you can become familiar with how your breasts look and feel and if your breasts are changing. This allows you to notice changes early. It can also offer you some reassurance that your breast health is good. One way to learn what is normal for your breasts and whether your breasts are changing is to do a breast self-exam. If you find a lump or something that was not present in the past, it is best to contact your caregiver right away. Other findings that should be evaluated by your caregiver include nipple discharge, especially if it is bloody; skin changes or reddening; areas where the skin seems to be pulled in (retracted); or new lumps and bumps. Breast pain is seldom associated with cancer (malignancy), but should also be evaluated by a caregiver. HOW TO PERFORM A BREAST SELF-EXAM The best time to examine your breasts is 5-7 days after your menstrual period is over. During menstruation, the breasts are lumpier, and it may be more difficult to pick up changes. If you do not menstruate, have reached menopause, or had your uterus removed (hysterectomy), you should examine your breasts at regular intervals, such as monthly. If you are breastfeeding, examine your breasts after a feeding or after using a breast pump. Breast implants do not decrease the risk for lumps or tumors, so continue to perform breast self-exams as recommended. Talk to your caregiver about how to determine the difference between the implant and breast tissue. Also, talk about the amount of pressure you should use during the exam. Over time, you will become more familiar with the variations of your breasts and more comfortable with the exam. A breast self-exam requires you to remove all your clothes above the waist. 1. Look at your breasts and nipples.  Stand in front of a mirror in a room with good lighting. With your hands on your hips, push your hands firmly downward. Look for a difference in shape, contour, and size from one breast to the other (asymmetry). Asymmetry includes puckers, dips, or bumps. Also, look for skin changes, such as reddened or scaly areas on the breasts. Look for nipple changes, such as discharge, dimpling, repositioning, or redness. 2. Carefully feel your breasts. This is best done either in the shower or tub while using soapy water or when flat on your back. Place the arm (on the side of the breast you are examining) above your head. Use the pads (not the fingertips) of your three middle fingers on your opposite hand to feel your breasts. Start in the underarm area and use  inch (2 cm) overlapping circles to feel your breast. Use 3 different levels of pressure (light, medium, and firm pressure) at each circle before moving to the next circle. The light pressure is needed to feel the tissue closest to the skin. The medium pressure will help to feel breast tissue a little deeper, while the firm pressure is needed to feel the tissue close to the ribs. Continue the overlapping circles, moving downward over the breast until you feel your ribs below your breast. Then, move one finger-width towards the center of the body. Continue to use the  inch (2 cm) overlapping circles to feel your breast as you move slowly up toward the collar bone (clavicle) near the base of the neck. Continue the up and down exam using all 3 pressures until you reach the   middle of the chest. Do this with each breast, carefully feeling for lumps or changes. 3.  Keep a written record with breast changes or normal findings for each breast. By writing this information down, you do not need to depend only on memory for size, tenderness, or location. Write down where you are in your menstrual cycle, if you are still menstruating. Breast tissue can have some lumps or  thick tissue. However, see your caregiver if you find anything that concerns you.  SEEK MEDICAL CARE IF:  You see a change in shape, contour, or size of your breasts or nipples.   You see skin changes, such as reddened or scaly areas on the breasts or nipples.   You have an unusual discharge from your nipples.   You feel a new lump or unusually thick areas.    This information is not intended to replace advice given to you by your health care provider. Make sure you discuss any questions you have with your health care provider.   Document Released: 05/20/2005 Document Revised: 05/06/2012 Document Reviewed: 09/04/2011 Elsevier Interactive Patient Education 2016 [employer]  

## 2015-08-29 NOTE — Progress Notes (Signed)
Date:  08/29/2015   Name:  Wendy Mercer   DOB:  04-29-1976   MRN:  [id number]   Chief Complaint: Follow-up and Herpes Simplex HPI She has been on medication for herpes for several.  She can not remember the last oral lesions.  She takes valtrex when she starts to get a cold or feels stressed. She has run out of medication in date.   Review of Systems  Constitutional: Negative for fever, chills and fatigue.  HENT: Negative for mouth sores, tinnitus and trouble swallowing.   Eyes: Negative for visual disturbance.  Respiratory: Negative for chest tightness and shortness of breath.   Cardiovascular: Negative for chest pain.  Gastrointestinal: Negative for abdominal pain.  Skin: Negative for color change and rash.  Neurological: Negative for dizziness, syncope, numbness and headaches.  Hematological: Negative for adenopathy.  Psychiatric/Behavioral: Negative for dysphoric mood.    Patient Active Problem List   Diagnosis Date Noted  . Hyperprolactinemia (HCC) 08/29/2015  . Herpes simplex infection 08/29/2015    Prior to Admission medications   Medication Sig Start Date End Date Taking? Authorizing Provider  acetaminophen (TYLENOL) 500 MG tablet Take 1,000 mg by mouth every 8 (eight) hours as needed for headache.   Yes Historical Provider, MD  ACZONE 7.5 % GEL Apply 1 application topically daily. 06/05/15  Yes Historical Provider, MD  DORYX MPC 120 MG TBEC Take 1 tablet by mouth daily. 07/30/15  Yes Historical Provider, MD  drospirenone-ethinyl estradiol (YAZ,GIANVI,LORYNA) 3-0.02 MG tablet Take 1 tablet by mouth daily. 02/07/15  Yes Historical Provider, MD  Prenatal Vit-Fe Fumarate-FA (MULTIVITAMIN-PRENATAL) 27-0.8 MG TABS tablet Take 1 tablet by mouth daily at 12 noon.   Yes Historical Provider, MD  valACYclovir (VALTREX) 1000 MG tablet Take 1,000 mg by mouth daily.   Yes Historical Provider, MD    No Known Allergies  Past Surgical History  Procedure Laterality Date  . Eye  surgery       lasik  . Tubal ligation Bilateral 07/28/2013    Procedure: HYSTEROSCOPY/ ATTEMPTED ESSURE TUBAL STERILIZATION;  Surgeon: Loney Laurence, MD;  Location: [facility];  Service: Gynecology;  Laterality: Bilateral;  . Laparoscopic tubal ligation Bilateral 09/22/2013    Procedure: BILATERAL LAPAROSCOPIC TUBAL LIGATION;  Surgeon: Loney Laurence, MD;  Location: [facility];  Service: Gynecology;  Laterality: Bilateral;    Social History  Substance Use Topics  . Smoking status: Never Smoker   . Smokeless tobacco: Never Used  . Alcohol Use: No    Medication list has been reviewed and updated.   Physical Exam  Constitutional: She is oriented to person, place, and time. She appears well-developed. No distress.  HENT:  Head: Normocephalic and atraumatic.  Neck: Normal range of motion. Neck supple.  Cardiovascular: Normal rate, regular rhythm and normal heart sounds.   Pulmonary/Chest: Effort normal and breath sounds normal. No respiratory distress. She has no wheezes.  Musculoskeletal: Normal range of motion. She exhibits no edema or tenderness.  Lymphadenopathy:    She has no cervical adenopathy.  Neurological: She is alert and oriented to person, place, and time.  Skin: Skin is warm and dry. No rash noted.  Psychiatric: She has a normal mood and affect. Her behavior is normal. Thought content normal.  Nursing note and vitals reviewed.   BP 110/62 mmHg  Pulse 64  Ht  (1.651 m)  Wt 136 lb 9.6 oz (61.961 kg)  BMI 22.73 kg/m2  Assessment and Plan: 1. Acne vulgaris Improved on OCPs and  Doryx Follow up with Dermatology as needed  2. Herpes simplex infection - valACYclovir (VALTREX) 1000 MG tablet; Take 1 tablet (1,000 mg total) by mouth daily.  Dispense: 30 tablet; Refill: 0  3. Hyperprolactinemia (HCC) Discussed s/s of enlarging pituitary gland  Bari EdwardLaura Karolyna Bianchini, MD [facility]  08/29/2015

## 2015-11-20 ENCOUNTER — Ambulatory Visit (INDEPENDENT_AMBULATORY_CARE_PROVIDER_SITE_OTHER): Payer: Managed Care, Other (non HMO) | Admitting: Internal Medicine

## 2015-11-20 ENCOUNTER — Other Ambulatory Visit: Payer: Self-pay

## 2015-11-20 ENCOUNTER — Encounter (INDEPENDENT_AMBULATORY_CARE_PROVIDER_SITE_OTHER): Payer: Self-pay

## 2015-11-20 ENCOUNTER — Encounter: Payer: Self-pay | Admitting: Internal Medicine

## 2015-11-20 VITALS — BP 104/62 | HR 84 | Temp 98.0°F | Resp 16 | Ht 64.0 in | Wt 136.6 lb

## 2015-11-20 DIAGNOSIS — E221 Hyperprolactinemia: Secondary | ICD-10-CM

## 2015-11-20 DIAGNOSIS — R55 Syncope and collapse: Secondary | ICD-10-CM | POA: Diagnosis not present

## 2015-11-20 DIAGNOSIS — Z299 Encounter for prophylactic measures, unspecified: Secondary | ICD-10-CM

## 2015-11-20 NOTE — Progress Notes (Signed)
Patient came in to have blood drawn per [facility]aura [facility].  Blood was drawn from the left arm without any incident.  Patient wants results sent to Dr. Judithann GravesBerglund when the results are finalized.

## 2015-11-20 NOTE — Progress Notes (Signed)
Date:  11/20/2015   Name:  Wendy Mercer   DOB:  19-Nov-1975   MRN:  [id number]   Chief Complaint: Loss of Consciousness Loss of Consciousness This is a new problem. The current episode started today. She lost consciousness for a period of less than 1 minute. The symptoms are aggravated by inactivity. Pertinent negatives include no chest pain, dizziness, fever, headaches or light-headedness.   Patient got 5 hr sleep last night and did not get to wake up well. She was standing up making coffee and felt lightheaded. She tried to make it to the couch but then passed out. Estimated out of it less than a minute but was confused with husband as he was waking her. She did not hit her head or sustain any other injury. Currently feels fine - not dizzy any more.  She ate breakfast and consumed fluids. Of note, she re-started Cabergline for Pituitary adenoma and amenorrhea yesterday evening.    Review of Systems  Constitutional: Negative for fever, chills and fatigue.  Eyes: Negative for visual disturbance.  Respiratory: Negative for cough, chest tightness and shortness of breath.   Cardiovascular: Positive for syncope. Negative for chest pain.  Musculoskeletal: Negative for myalgias, arthralgias and neck pain.  Neurological: Negative for dizziness, light-headedness and headaches.    Patient Active Problem List   Diagnosis Date Noted  . Hyperprolactinemia (HCC) 08/29/2015  . Herpes simplex infection 08/29/2015  . Acne 08/29/2015    Prior to Admission medications   Medication Sig Start Date End Date Taking? Authorizing Provider  DORYX MPC 120 MG TBEC Take 1 tablet by mouth daily. 07/30/15  Yes Historical Provider, MD  drospirenone-ethinyl estradiol (YAZ,GIANVI,LORYNA) 3-0.02 MG tablet Take 1 tablet by mouth daily. 02/07/15  Yes Historical Provider, MD  tretinoin (RETIN-A) 0.025 % cream  09/29/15  Yes Historical Provider, MD  valACYclovir (VALTREX) 1000 MG tablet Take 1 tablet (1,000 mg total) by  mouth daily. 08/29/15  Yes Reubin Milan, MD    No Known Allergies  Past Surgical History  Procedure Laterality Date  . Eye surgery       lasik  . Tubal ligation Bilateral 07/28/2013    Procedure: HYSTEROSCOPY/ ATTEMPTED ESSURE TUBAL STERILIZATION;  Surgeon: Loney Laurence, MD;  Location: [facility];  Service: Gynecology;  Laterality: Bilateral;  . Laparoscopic tubal ligation Bilateral 09/22/2013    Procedure: BILATERAL LAPAROSCOPIC TUBAL LIGATION;  Surgeon: Loney Laurence, MD;  Location: [facility];  Service: Gynecology;  Laterality: Bilateral;    Social History  Substance Use Topics  . Smoking status: Never Smoker   . Smokeless tobacco: Never Used  . Alcohol Use: No     Medication list has been reviewed and updated.   Physical Exam  Constitutional: She is oriented to person, place, and time. She appears well-developed. No distress.  HENT:  Head: Normocephalic and atraumatic.  Eyes: Pupils are equal, round, and reactive to light.  Neck: Normal range of motion. Neck supple. No thyromegaly present.  Cardiovascular: Normal rate, regular rhythm and normal heart sounds.   Pulmonary/Chest: Effort normal and breath sounds normal. No respiratory distress.  Musculoskeletal: Normal range of motion.  Neurological: She is alert and oriented to person, place, and time.  Skin: Skin is warm and dry. No rash noted.  Psychiatric: She has a normal mood and affect. Her behavior is normal. Thought content normal.  Nursing note and vitals reviewed.   BP 104/62 mmHg  Pulse 84  Temp(Src) 98 F (36.7 C)  Resp 16  Ht 5\' 4"  (1.626 m)  Wt 136 lb 9.6 oz (61.961 kg)  BMI 23.44 kg/m2  SpO2 98%  Assessment and Plan: 1. Hyperprolactinemia (HCC) Syncope from Cabergoline - recommend hydrating well prior to dose and follow up with Endocrinology if persistent side effects - Prolactin  2. Syncope, non cardiac Recommendations as above - CBC with Differential/Platelet - Comprehensive metabolic  panel   Bari EdwardLaura Berglund, MD [facility]  11/20/2015

## 2015-11-21 LAB — CBC WITH DIFFERENTIAL/PLATELET
BASOS ABS: 0 10*3/uL (ref 0.0–0.2)
Basos: 1 %
EOS (ABSOLUTE): 0.1 10*3/uL (ref 0.0–0.4)
Eos: 1 %
HEMATOCRIT: 40.2 % (ref 34.0–46.6)
Hemoglobin: 13.6 g/dL (ref 11.1–15.9)
IMMATURE GRANS (ABS): 0 10*3/uL (ref 0.0–0.1)
Immature Granulocytes: 0 %
LYMPHS ABS: 1.4 10*3/uL (ref 0.7–3.1)
Lymphs: 23 %
MCH: 29.7 pg (ref 26.6–33.0)
MCHC: 33.8 g/dL (ref 31.5–35.7)
MCV: 88 fL (ref 79–97)
Monocytes Absolute: 0.2 10*3/uL (ref 0.1–0.9)
Monocytes: 3 %
NEUTROS ABS: 4.5 10*3/uL (ref 1.4–7.0)
Neutrophils: 72 %
Platelets: 312 10*3/uL (ref 150–379)
RBC: 4.58 x10E6/uL (ref 3.77–5.28)
RDW: 12.6 % (ref 12.3–15.4)
WBC: 6.2 10*3/uL (ref 3.4–10.8)

## 2015-11-21 LAB — COMPREHENSIVE METABOLIC PANEL
A/G RATIO: 1.5 (ref 1.2–2.2)
ALBUMIN: 4.4 g/dL (ref 3.5–5.5)
ALT: 7 IU/L (ref 0–32)
AST: 15 IU/L (ref 0–40)
Alkaline Phosphatase: 50 IU/L (ref 39–117)
BILIRUBIN TOTAL: 0.3 mg/dL (ref 0.0–1.2)
BUN / CREAT RATIO: 11 (ref 9–23)
BUN: 10 mg/dL (ref 6–20)
CALCIUM: 9.8 mg/dL (ref 8.7–10.2)
CHLORIDE: 101 mmol/L (ref 96–106)
CO2: 23 mmol/L (ref 18–29)
Creatinine, Ser: 0.94 mg/dL (ref 0.57–1.00)
GFR, EST AFRICAN AMERICAN: 89 mL/min/{1.73_m2} (ref >59)
GFR, EST NON AFRICAN AMERICAN: 77 mL/min/{1.73_m2} (ref >59)
GLOBULIN, TOTAL: 3 g/dL (ref 1.5–4.5)
Glucose: 95 mg/dL (ref 65–99)
POTASSIUM: 4.8 mmol/L (ref 3.5–5.2)
Sodium: 142 mmol/L (ref 134–144)
Total Protein: 7.4 g/dL (ref 6.0–8.5)

## 2015-11-21 LAB — PROLACTIN: PROLACTIN: 18.9 ng/mL (ref 4.8–23.3)

## 2015-12-08 ENCOUNTER — Encounter: Payer: Self-pay | Admitting: Internal Medicine

## 2016-04-02 ENCOUNTER — Other Ambulatory Visit: Payer: Self-pay | Admitting: Internal Medicine

## 2016-04-02 DIAGNOSIS — L700 Acne vulgaris: Secondary | ICD-10-CM

## 2016-04-09 ENCOUNTER — Ambulatory Visit (INDEPENDENT_AMBULATORY_CARE_PROVIDER_SITE_OTHER): Payer: 59 | Admitting: Vascular Surgery

## 2016-04-09 ENCOUNTER — Encounter (INDEPENDENT_AMBULATORY_CARE_PROVIDER_SITE_OTHER): Payer: Self-pay

## 2016-04-09 ENCOUNTER — Encounter (INDEPENDENT_AMBULATORY_CARE_PROVIDER_SITE_OTHER): Payer: Self-pay | Admitting: Vascular Surgery

## 2016-04-09 VITALS — BP 128/88 | HR 85 | Resp 17 | Ht 64.0 in | Wt 135.0 lb

## 2016-04-09 DIAGNOSIS — I83813 Varicose veins of bilateral lower extremities with pain: Secondary | ICD-10-CM | POA: Diagnosis not present

## 2016-04-09 NOTE — Progress Notes (Signed)
Patient ID: Wendy Mercer, female   DOB: 09/16/1975, 40 y.o.   MRN: [id number]  Chief Complaint  Patient presents with  . New Patient (Initial Visit)    HPI Wendy HoveRasheda Gaylene BrooksVinson Rahe is a 40 y.o. female. The patient presents with complaints of symptomatic varicosities of the Lower extremities. The patient reports a long standing history of varicosities and they have become painful over time. There was no clear inciting event or causative factor that started the symptoms.  The right leg is slightly more affected. The patient elevates the legs for relief. The pain is described as itching and burning. The symptoms are generally most severe in the evening, particularly when they have been on their feet for long periods of time. Elevation has been used to try to improve the symptoms with limited success.The patient has no previous history of deep venous thrombosis or superficial thrombophlebitis to their knowledge.     Past Medical History:  Diagnosis Date  . Abnormal Pap smear   . Cyst of pituitary gland (HCC)   . Headache(784.0)    otc med prn  . Ovarian cyst   . SVD (spontaneous vaginal delivery)    x 1    Past Surgical History:  Procedure Laterality Date  . EYE SURGERY      lasik  . LAPAROSCOPIC TUBAL LIGATION Bilateral 09/22/2013   Procedure: BILATERAL LAPAROSCOPIC TUBAL LIGATION;  Surgeon: Loney LaurenceMichelle A Horvath, MD;  Location: [facility];  Service: Gynecology;  Laterality: Bilateral;  . TUBAL LIGATION    . TUBAL LIGATION Bilateral 07/28/2013   Procedure: HYSTEROSCOPY/ ATTEMPTED ESSURE TUBAL STERILIZATION;  Surgeon: Loney LaurenceMichelle A Horvath, MD;  Location: [facility];  Service: Gynecology;  Laterality: Bilateral;    Family History  Problem Relation Age of Onset  . Hypertension Mother   . Hypertension Father      Social History Social History  Substance Use Topics  . Smoking status: Never Smoker  . Smokeless tobacco: Never Used  . Alcohol use No     No Known Allergies  Current  Outpatient Prescriptions  Medication Sig Dispense Refill  . cabergoline (DOSTINEX) 0.5 MG tablet Take 0.125 mg by mouth 2 (two) times a week.     [place]. DORYX MPC 120 MG TBEC Take 1 tablet by mouth daily.  0  . drospirenone-ethinyl estradiol (YAZ,GIANVI,LORYNA) 3-0.02 MG tablet Take 1 tablet by mouth daily.    Wendy Mercer. tretinoin (RETIN-A) 0.025 % cream   0  . valACYclovir (VALTREX) 1000 MG tablet take 1 tablet by mouth once daily 30 tablet 0   No current facility-administered medications for this visit.       REVIEW OF SYSTEMS (Negative unless checked)  Constitutional: [] Weight loss  [] Fever  [] Chills Cardiac: [] Chest pain   [] Chest pressure   [] Palpitations   [] Shortness of breath when laying flat   [] Shortness of breath at rest   [] Shortness of breath with exertion. Vascular:  [] Pain in legs with walking   [] Pain in legs at rest   [] Pain in legs when laying flat   [] Claudication   [] Pain in feet when walking  [] Pain in feet at rest  [] Pain in feet when laying flat   [] History of DVT   [] Phlebitis   [] Swelling in legs   [x] Varicose veins   [] Non-healing ulcers Pulmonary:   [] Uses home oxygen   [] Productive cough   [] Hemoptysis   [] Wheeze  [] COPD   [] Asthma Neurologic:  [] Dizziness  [] Blackouts   [] Seizures   [] History of stroke   []   History of TIA  [] Aphasia   [] Temporary blindness   [] Dysphagia   [] Weakness or numbness in arms   [] Weakness or numbness in legs Musculoskeletal:  [] Arthritis   [] Joint swelling   [] Joint pain   [] Low back pain Hematologic:  [] Easy bruising  [] Easy bleeding   [] Hypercoagulable state   [] Anemic  [] Hepatitis Gastrointestinal:  [] Blood in stool   [] Vomiting blood  [] Gastroesophageal reflux/heartburn   [] Abdominal pain Genitourinary:  [] Chronic kidney disease   [] Difficult urination  [] Frequent urination  [] Burning with urination   [] Hematuria Skin:  [] Rashes   [] Ulcers   [] Wounds Psychological:  [] History of anxiety   []  History of major depression.    Physical Exam BP  128/88   Pulse 85   Resp 17   Ht 5\' 4"  (1.626 m)   Wt 135 lb (61.2 kg)   BMI 23.17 kg/m  Gen:  WD/WN, NAD Head: [place]/AT, No temporalis wasting.  Ear/Nose/Throat: Hearing grossly intact, oropharynx clear Eyes: Sclera non-icteric. Conjunctiva clear Neck: Supple, no nuchal rigidity. Trachea midline Pulmonary:  Good air movement, no use of accessory muscles, respirations not labored.  Cardiac: RRR, No JVD Vascular: Varicosities diffuse and measuring up to 2-3 mm in the right lower extremity        Varicosities scattered and measuring up to 2 mm in the left lower extremity Vessel Right Left  Radial Palpable Palpable  Ulnar Palpable Palpable  Brachial Palpable Palpable  Carotid Palpable, without bruit Palpable, without bruit  Aorta Not palpable N/A  Femoral Palpable Palpable  Popliteal Palpable Palpable  PT Palpable Palpable  DP Palpable Palpable   Gastrointestinal: soft, non-tender/non-distended. No guarding/reflex. No masses, surgical incisions, or scars. Musculoskeletal: M/S 5/5 throughout.   No RLE edema.  No LLE edema Neurologic: Sensation grossly intact in extremities.  Symmetrical.  Speech is fluent.  Psychiatric: Judgment intact, Mood & affect appropriate for pt's clinical situation. Dermatologic: No rashes or ulcers noted.  No cellulitis or open wounds. Lymph : No Cervical, Axillary, or Inguinal lymphadenopathy.   Radiology No results found.  Labs No results found for this or any previous visit (from the past 2160 hour(s)).  Assessment/Plan:  Varicose veins of both lower extremities with pain The patient has unsightly and locally bothersome varicose veins that she is interested in having treatment for. I have discussed the role of sclerotherapy for the treatment of superficial varicosities. I have discussed the pathophysiology and natural history of venous disease. I prescribed 20-30 mmHg compression stockings today. I have discussed the risks and benefits of  sclerotherapy and she desires to proceed.        Festus BarrenJason Silvana Mercer 04/09/2016, 2:39 PM   This note was created with Dragon medical transcription system.  Any errors from dictation are unintentional.

## 2016-04-09 NOTE — Assessment & Plan Note (Signed)
The patient has unsightly and locally bothersome varicose veins that she is interested in having treatment for. I have discussed the role of sclerotherapy for the treatment of superficial varicosities. I have discussed the pathophysiology and natural history of venous disease. I prescribed 20-30 mmHg compression stockings today. I have discussed the risks and benefits of sclerotherapy and she desires to proceed.

## 2016-06-10 IMAGING — CR SKULL - COMPLETE 4 + VIEW
4 series · 4 of 4 positions shown · non-contrast
Comparison: MRI of the brain January 03, 2011

CLINICAL DATA: Mass in the right temple region for many years;
onset of dizziness and headaches for 2 months

EXAM:
SKULL - COMPLETE 4 + VIEW

[skull pa]
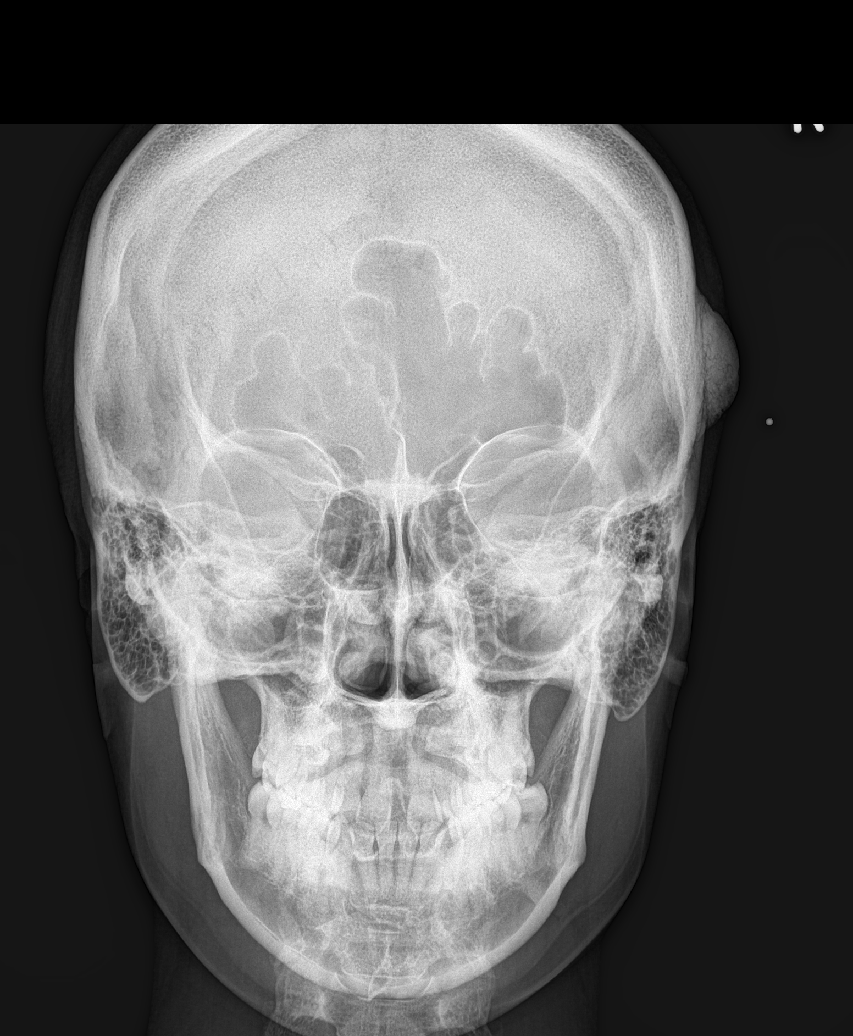

[skull townes]
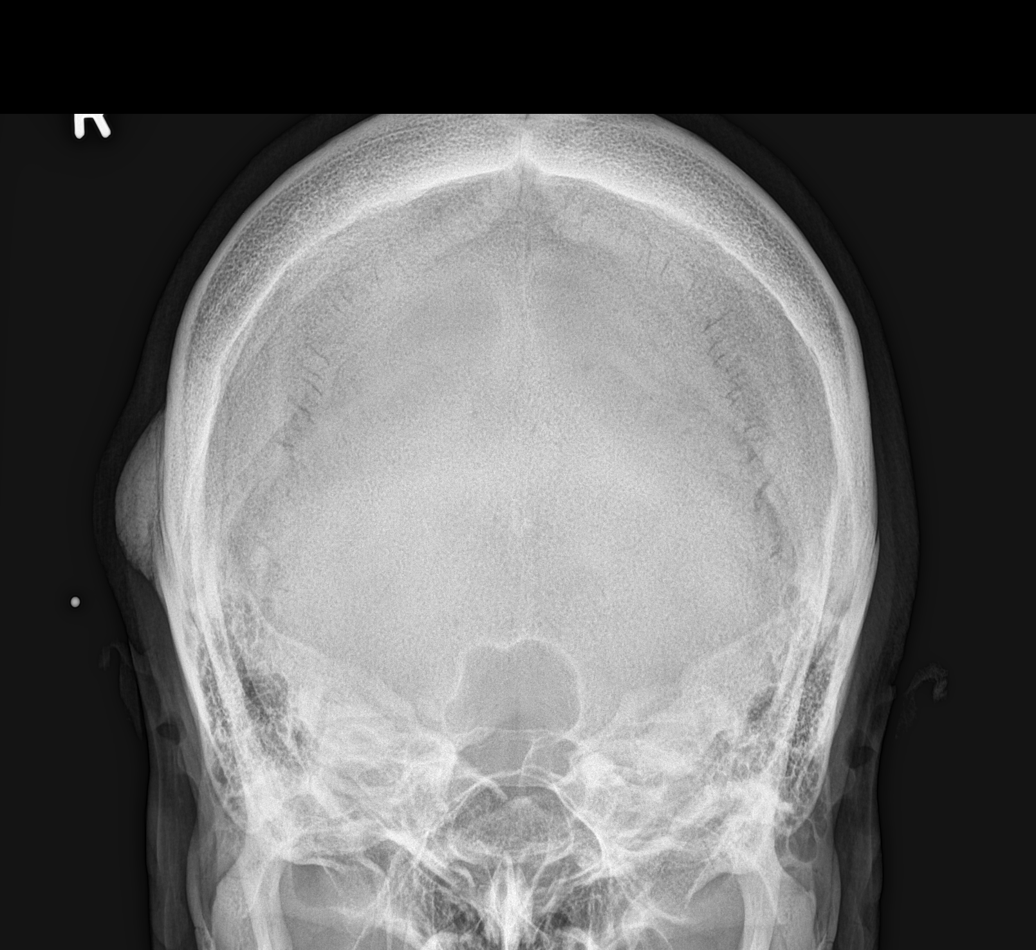

[skull lat (1 of 2)]
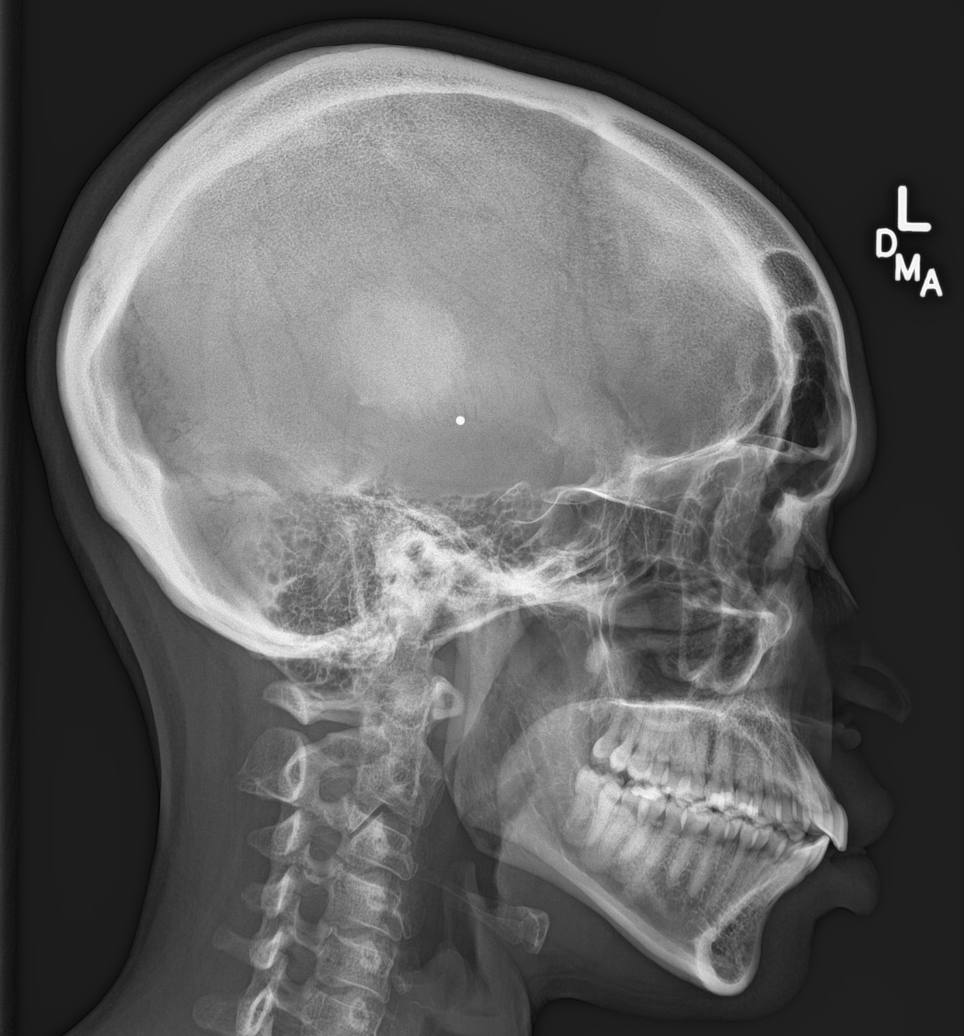

[skull lat (2 of 2)]
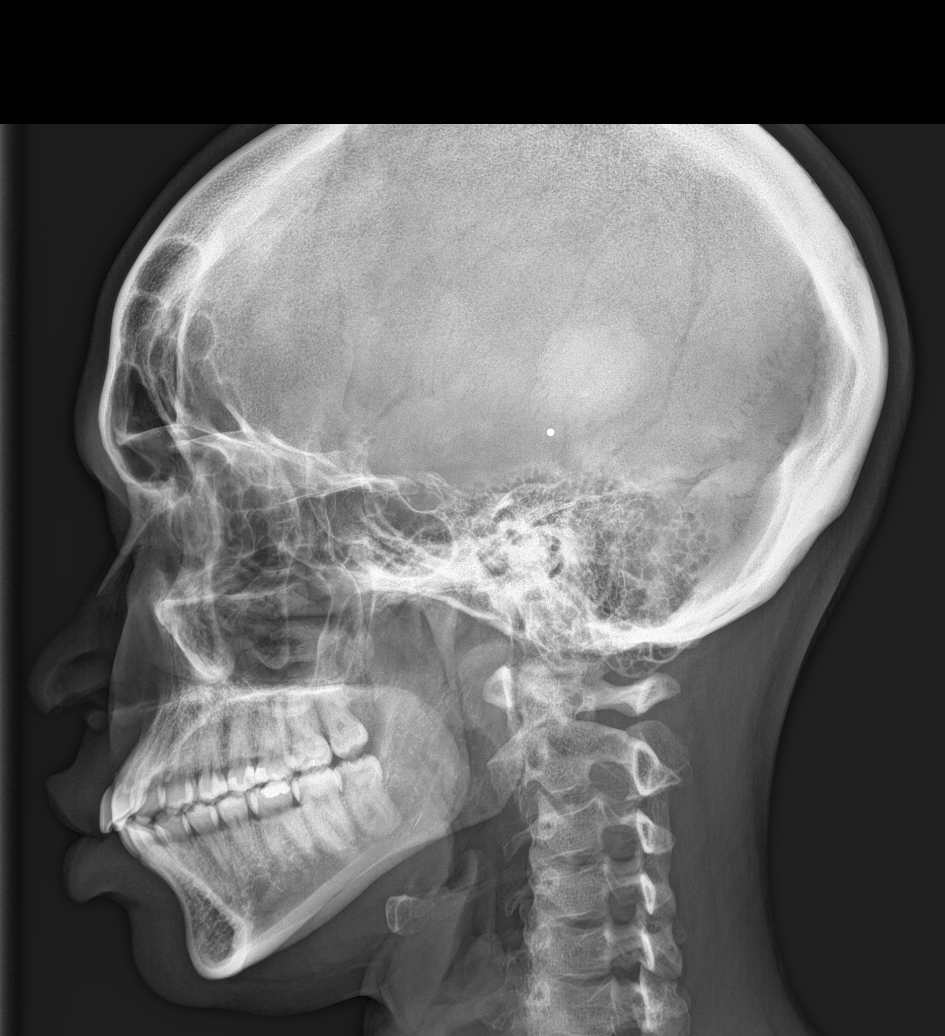

[4 of 4 positions shown; findings below may reference images not displayed]

FINDINGS: There is an exostosis like structure arising from the outer table of
the right temporoparietal calvarium. This is deep to the area
marked. It measures 0.9 cm in thickness, 3.5 cm in superior-
inferior dimension, and 3.5 cm AP. It has increased slightly in size
since the previous MRI. No other calvarial lesions are demonstrated.
IMPRESSION: There is an bony mass arising from the outer table of the right
temporal bone which likely reflects a benign osteoma. Further
evaluation with CT scanning would be useful to better assess the
relationship of the structure with the underlying medullary portion
of the calvarium.

## 2016-09-04 ENCOUNTER — Ambulatory Visit: Payer: Self-pay | Admitting: Physician Assistant

## 2016-09-04 ENCOUNTER — Encounter: Payer: Self-pay | Admitting: Physician Assistant

## 2016-09-04 VITALS — BP 100/75 | HR 62 | Temp 98.4°F

## 2016-09-04 DIAGNOSIS — R21 Rash and other nonspecific skin eruption: Secondary | ICD-10-CM

## 2016-09-04 DIAGNOSIS — T148XXA Other injury of unspecified body region, initial encounter: Secondary | ICD-10-CM

## 2016-09-04 NOTE — Progress Notes (Signed)
S: c/o rash on wrists, some on leg, one area at nose is different than the others, states the rash at her nose has been there several weeks, ?if its ringworm, recently got a tattoo on her ankle, now has red bumps on her legs, red areas at both wrists, some petechiae at wrists, no known injury, no new products  O: vitals wnl, nad, area at nose is circular, scaly, dry; area on wrists is red about 2 inches wide and goes wrists, not on volar surface, some petechiae in the rash, areas on lower legs appear to be razor bumps, n/v intact, tattoo appears to be healing well, no redness or drainage from the site  A: rash, bruising, recent tattoo  P: cbc, reassurance, lamisil for area by the nose

## 2016-09-05 LAB — CBC WITH DIFFERENTIAL/PLATELET
BASOS: 1 %
Basophils Absolute: 0 10*3/uL (ref 0.0–0.2)
EOS (ABSOLUTE): 0.2 10*3/uL (ref 0.0–0.4)
Eos: 4 %
HEMOGLOBIN: 13.3 g/dL (ref 11.1–15.9)
Hematocrit: 39.4 % (ref 34.0–46.6)
Immature Grans (Abs): 0 10*3/uL (ref 0.0–0.1)
Immature Granulocytes: 0 %
LYMPHS ABS: 1.9 10*3/uL (ref 0.7–3.1)
Lymphs: 42 %
MCH: 30 pg (ref 26.6–33.0)
MCHC: 33.8 g/dL (ref 31.5–35.7)
MCV: 89 fL (ref 79–97)
Monocytes Absolute: 0.2 10*3/uL (ref 0.1–0.9)
Monocytes: 5 %
Neutrophils Absolute: 2.2 10*3/uL (ref 1.4–7.0)
Neutrophils: 48 %
Platelets: 285 10*3/uL (ref 150–379)
RBC: 4.43 x10E6/uL (ref 3.77–5.28)
RDW: 12.7 % (ref 12.3–15.4)
WBC: 4.4 10*3/uL (ref 3.4–10.8)

## 2017-01-07 ENCOUNTER — Other Ambulatory Visit: Payer: Self-pay | Admitting: Internal Medicine

## 2017-01-07 DIAGNOSIS — L700 Acne vulgaris: Secondary | ICD-10-CM

## 2017-07-10 ENCOUNTER — Ambulatory Visit: Payer: Self-pay | Admitting: Medical

## 2017-07-10 VITALS — BP 120/80 | HR 93 | Temp 97.8°F | Resp 16

## 2017-07-10 DIAGNOSIS — J111 Influenza due to unidentified influenza virus with other respiratory manifestations: Secondary | ICD-10-CM

## 2017-07-10 LAB — POCT INFLUENZA A/B
INFLUENZA A, POC: NEGATIVE
Influenza B, POC: NEGATIVE

## 2017-07-10 MED ORDER — OSELTAMIVIR PHOSPHATE 75 MG PO CAPS: 75.0000 mg | ORAL_CAPSULE | Freq: Two times a day (BID) | ORAL | 0 refills | Status: DC

## 2017-07-10 NOTE — Progress Notes (Signed)
   Subjective:    Patient ID: Tyna JakschRasheda Vinson Nies, female    DOB: 04/02/1976, 42 y.o.   MRN: [id number]  HPI 42 yo female in non acute distress. Yesterday with symptoms of fever 101, chills, HA  Forehead and body aches, cough , dry cough, post nasal drip. Mild abdominal pain now generalized.  Took Tylenol  2 tablets of  500mg  at 6:30am.   Review of Systems  Constitutional: Positive for chills and fever.  HENT: Positive for congestion. Negative for ear discharge and sore throat.   Respiratory: Positive for cough.   Cardiovascular: Negative for chest pain.  Gastrointestinal: Negative for abdominal pain.  Musculoskeletal: Negative for myalgias.  Skin: Negative for rash.  Neurological: Positive for dizziness (yesterday none now.).       Objective:   Physical Exam  Constitutional: She is oriented to person, place, and time. She appears well-developed and well-nourished.  HENT:  Head: Normocephalic and atraumatic.  Right Ear: External ear normal.  Left Ear: External ear normal.  Mouth/Throat: Oropharynx is clear and moist.  Eyes: Conjunctivae and EOM are normal. Pupils are equal, round, and reactive to light.  Neck: Normal range of motion. Neck supple.  Cardiovascular: Normal rate, regular rhythm and normal heart sounds.  Pulmonary/Chest: Effort normal and breath sounds normal.  Lymphadenopathy:    She has no cervical adenopathy.  Neurological: She is alert and oriented to person, place, and time.  Skin: Skin is warm and dry.  Psychiatric: She has a normal mood and affect. Her behavior is normal. Judgment and thought content normal.  Nursing note and vitals reviewed.   Flu test negative    Assessment & Plan:   Influenza like lillness Wanted Tamiflu , just incase. Reviewed with the patient that it does not cure the flu but can shorten the duration of illness. Meds ordered this encounter  Medications  . oseltamivir (TAMIFLU) 75 MG capsule    Sig: Take 1 capsule (75 mg total)  by mouth 2 (two) times daily.    Dispense:  10 capsule    Refill:  0  Rest , increase fluids, Must fever free x 48 hours before returning to work. Return to clinic if not improving in 3-7 days .  Patient verbalizes understanding and has no questions at discharge.

## 2017-09-01 ENCOUNTER — Ambulatory Visit: Payer: Self-pay | Admitting: Family Medicine

## 2017-09-01 ENCOUNTER — Encounter: Payer: Self-pay | Admitting: Family Medicine

## 2017-09-01 VITALS — BP 114/76 | HR 99 | Temp 98.0°F

## 2017-09-01 DIAGNOSIS — J029 Acute pharyngitis, unspecified: Secondary | ICD-10-CM

## 2017-09-01 DIAGNOSIS — J020 Streptococcal pharyngitis: Secondary | ICD-10-CM

## 2017-09-01 LAB — POCT RAPID STREP A (OFFICE): Rapid Strep A Screen: POSITIVE — AB

## 2017-09-01 MED ORDER — PENICILLIN V POTASSIUM 500 MG PO TABS: 500.0000 mg | ORAL_TABLET | Freq: Two times a day (BID) | ORAL | 0 refills | Status: AC

## 2017-09-01 NOTE — Progress Notes (Signed)
Subjective: Sore throat     Wendy Mercer is a 42 y.o. female who presents for evaluation of sore throat and enlarged lymph nodes in her anterior neck for 2 days.  Patient's 40-year-old daughter at home was just diagnosed with strep throat.  Denies any other symptoms including fevers or chills.  Reports pain with swallowing but that she is able to eat and drink without difficulty.  Treatment to date: Ibuprofen as needed.  Currently not taking any other medications. Denies rash, nausea, vomiting, diarrhea, shortness of breath, wheezing, cough difficulty swallowing, confusion, AMS, nasal congestion, facial pressure, headache body aches, fatigue, or severe symptoms.    Medical history: Patient denies. Antibiotic use in the last 3 months: Denies.   Review of Systems Pertinent items noted in HPI and remainder of comprehensive ROS otherwise negative.     Objective:   Physical Exam General: Awake, alert, and oriented. No acute distress. Well developed, hydrated and nourished. Appears stated age. Nontoxic appearance.  HEENT: No PND noted.  Moderate erythema and edema to posterior oropharynx.  No exudates of pharynx or tonsils. No erythema or bulging of TM.  No erythema/edema to nasal mucosa. Sinuses nontender. Supple neck with tender anterior cervical lymphadenopathy. Cardiac: Heart rate and rhythm are normal. No murmurs, gallops, or rubs are auscultated. S1 and S2 are heard and are of normal intensity.  Respiratory: No signs of respiratory distress. Lungs clear. No tachypnea. Able to speak in full sentences without dyspnea. Nonlabored respirations.  Skin: Skin is warm, dry and intact. Appropriate color for ethnicity. No cyanosis noted.   Diagnostic Results: Rapid strep positive.  Assessment:    strep pharyngitis   Plan:    Suggested symptomatic OTC remedies.   10-day course of penicillin prescribed.  Discussed ways to prevent transmission and provided patient with a work note. Discussed  red flag symptoms and circumstances with which to seek medical care.  Follow-up with primary care provider.  New Prescriptions   PENICILLIN V POTASSIUM (VEETID) 500 MG TABLET    Take 1 tablet (500 mg total) by mouth 2 (two) times daily for 10 days.

## 2018-06-23 ENCOUNTER — Encounter: Payer: Self-pay | Admitting: Internal Medicine

## 2018-07-14 ENCOUNTER — Encounter: Payer: Self-pay | Admitting: Internal Medicine

## 2018-07-14 ENCOUNTER — Other Ambulatory Visit: Payer: Self-pay

## 2018-07-14 ENCOUNTER — Ambulatory Visit (INDEPENDENT_AMBULATORY_CARE_PROVIDER_SITE_OTHER): Payer: Managed Care, Other (non HMO) | Admitting: Internal Medicine

## 2018-07-14 VITALS — BP 108/60 | HR 67 | Ht 64.0 in | Wt 134.0 lb

## 2018-07-14 DIAGNOSIS — R300 Dysuria: Secondary | ICD-10-CM | POA: Diagnosis not present

## 2018-07-14 DIAGNOSIS — L729 Follicular cyst of the skin and subcutaneous tissue, unspecified: Secondary | ICD-10-CM | POA: Diagnosis not present

## 2018-07-14 DIAGNOSIS — Z0189 Encounter for other specified special examinations: Secondary | ICD-10-CM

## 2018-07-14 DIAGNOSIS — Z008 Encounter for other general examination: Secondary | ICD-10-CM

## 2018-07-14 LAB — POCT URINALYSIS DIPSTICK
Bilirubin, UA: NEGATIVE
Glucose, UA: NEGATIVE
Ketones, UA: NEGATIVE
NITRITE UA: NEGATIVE
Protein, UA: NEGATIVE
RBC UA: NEGATIVE
SPEC GRAV UA: 1.02 (ref 1.010–1.025)
UROBILINOGEN UA: 0.2 U/dL
pH, UA: 6 (ref 5.0–8.0)

## 2018-07-14 MED ORDER — NITROFURANTOIN MONOHYD MACRO 100 MG PO CAPS: 100.0000 mg | ORAL_CAPSULE | Freq: Two times a day (BID) | ORAL | 0 refills | Status: AC

## 2018-07-14 NOTE — Progress Notes (Signed)
Date:  07/14/2018   Name:  Wendy Mercer   DOB:  11-05-1975   MRN:  [id number]   Chief Complaint: Annual Exam (biometric screening) Patient recently seen by GYN for WWE, pap and breast exam completed. Here today for biometric screening for work. Dysuria   This is a new problem. The current episode started in the past 7 days. The problem occurs every urination. The quality of the pain is described as burning. The pain is mild. There has been no fever. Pertinent negatives include no chills. She has tried increased fluids for the symptoms. The treatment provided mild relief.    Review of Systems  Constitutional: Negative for chills and fatigue.  HENT: Negative for trouble swallowing.   Eyes: Negative for visual disturbance.  Respiratory: Negative for shortness of breath.   Cardiovascular: Negative for chest pain, palpitations and leg swelling.  Gastrointestinal: Negative for abdominal pain.  Genitourinary: Positive for dysuria. Negative for menstrual problem.  Skin: Negative for rash.       Persistent cyst on right side of scalp  Neurological: Negative for dizziness and headaches.  Psychiatric/Behavioral: Negative for dysphoric mood and sleep disturbance.    Patient Active Problem List   Diagnosis Date Noted  . Varicose veins of both lower extremities with pain 04/09/2016  . Hyperprolactinemia (HCC) 08/29/2015  . Herpes simplex infection 08/29/2015  . Acne 08/29/2015  . 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 EYE SURGERY      lasik  . LAPAROSCOPIC TUBAL LIGATION Bilateral 09/22/2013   Procedure: BILATERAL LAPAROSCOPIC TUBAL LIGATION;  Surgeon: Loney Laurence, MD;  Location: [facility];  Service: Gynecology;  Laterality: Bilateral;  . TUBAL LIGATION Bilateral 07/28/2013   Procedure: HYSTEROSCOPY/ ATTEMPTED ESSURE TUBAL STERILIZATION;  Surgeon: Loney Laurence, MD;  Location: [facility]  [facility];  Service: Gynecology;  Laterality: Bilateral;    Social History   Tobacco Use  . Smoking status: Never Smoker  . Smokeless tobacco: Never Used  Substance Use Topics  . Alcohol use: No    Alcohol/week: 2.0 standard drinks    Types: 2 Standard drinks or equivalent per week  . Drug use: No     Medication list has been reviewed and updated.  Current Meds  Medication Sig  . drospirenone-ethinyl estradiol (YAZ,GIANVI,LORYNA) 3-0.02 MG tablet Take 1 tablet by mouth daily.  Marland Kitchen ibuprofen (ADVIL,MOTRIN) 200 MG tablet Take 200 mg by mouth every 6 (six) hours as needed.    PHQ 2/9 Scores 07/14/2018  PHQ - 2 Score 0  PHQ- 9 Score 0    Physical Exam Vitals signs and nursing note reviewed.  Constitutional:      General: She is not in acute distress.    Appearance: She is well-developed.  HENT:     Head: Normocephalic and atraumatic.     Right Ear: Tympanic membrane and ear canal normal.     Left Ear: Tympanic membrane and ear canal normal.     Nose:     Right Sinus: No maxillary sinus tenderness.     Left Sinus: No maxillary sinus tenderness.     Mouth/Throat:     Pharynx: Uvula midline.  Eyes:     General: No scleral icterus.       Right eye: No discharge.        Left eye: No discharge.     Conjunctiva/sclera: Conjunctivae normal.  Neck:     Musculoskeletal: Normal range of motion.  No erythema.     Thyroid: No thyromegaly.     Vascular: No carotid bruit.  Cardiovascular:     Rate and Rhythm: Normal rate and regular rhythm.     Pulses: Normal pulses.     Heart sounds: Normal heart sounds.  Pulmonary:     Effort: Pulmonary effort is normal. No respiratory distress.     Breath sounds: No wheezing.  Abdominal:     General: Bowel sounds are normal.     Palpations: Abdomen is soft.     Tenderness: There is abdominal tenderness in the suprapubic area. There is no guarding or rebound.  Musculoskeletal: Normal range of motion.  Lymphadenopathy:     Cervical: No cervical  adenopathy.  Skin:    General: Skin is warm and dry.     Findings: No rash.       Neurological:     Mental Status: She is alert and oriented to person, place, and time.     Cranial Nerves: No cranial nerve deficit.     Sensory: No sensory deficit.     Deep Tendon Reflexes: Reflexes are normal and symmetric.  Psychiatric:        Speech: Speech normal.        Behavior: Behavior normal.        Thought Content: Thought content normal.    Waist circumference 30 inch. BP 108/60   Pulse 67   Ht 5\' 4"  (1.626 m)   Wt 134 lb (60.8 kg)   SpO2 100%   BMI 23.00 kg/m   Assessment and Plan: 1. Encounter for biometric screening Exam completed, labs ordered - Comprehensive metabolic panel - Lipid panel  2. Dysuria Continue increased fluids - POCT urinalysis dipstick - nitrofurantoin, macrocrystal-monohydrate, (MACROBID) 100 MG capsule; Take 1 capsule (100 mg total) by mouth 2 (two) times daily for 3 days.  Dispense: 6 capsule; Refill: 0  3. Scalp cyst Benign - monitor for change   Partially dictated using Dragon software. Any errors are unintentional.  Bari EdwardLaura Anh Bigos, MD [facility]  07/14/2018

## 2018-07-15 LAB — COMPREHENSIVE METABOLIC PANEL
A/G RATIO: 1.6 (ref 1.2–2.2)
ALBUMIN: 4.7 g/dL (ref 3.8–4.8)
ALT: 10 IU/L (ref 0–32)
AST: 17 IU/L (ref 0–40)
Alkaline Phosphatase: 52 IU/L (ref 39–117)
BUN/Creatinine Ratio: 10 (ref 9–23)
BUN: 10 mg/dL (ref 6–24)
Bilirubin Total: 0.5 mg/dL (ref 0.0–1.2)
CO2: 23 mmol/L (ref 20–29)
Calcium: 9.9 mg/dL (ref 8.7–10.2)
Chloride: 103 mmol/L (ref 96–106)
Creatinine, Ser: 0.99 mg/dL (ref 0.57–1.00)
GFR calc Af Amer: 82 mL/min/{1.73_m2} (ref >59)
GFR calc non Af Amer: 71 mL/min/{1.73_m2} (ref >59)
Globulin, Total: 2.9 g/dL (ref 1.5–4.5)
Glucose: 81 mg/dL (ref 65–99)
Potassium: 4.3 mmol/L (ref 3.5–5.2)
Sodium: 142 mmol/L (ref 134–144)
Total Protein: 7.6 g/dL (ref 6.0–8.5)

## 2018-07-15 LAB — LIPID PANEL
Chol/HDL Ratio: 1.8 ratio (ref 0.0–4.4)
Cholesterol, Total: 147 mg/dL (ref 100–199)
HDL: 82 mg/dL (ref >39)
LDL Calculated: 47 mg/dL (ref 0–99)
Triglycerides: 92 mg/dL (ref 0–149)
VLDL Cholesterol Cal: 18 mg/dL (ref 5–40)

## 2019-12-15 ENCOUNTER — Ambulatory Visit: Payer: Managed Care, Other (non HMO) | Admitting: Internal Medicine

## 2020-02-21 ENCOUNTER — Ambulatory Visit (INDEPENDENT_AMBULATORY_CARE_PROVIDER_SITE_OTHER): Payer: Managed Care, Other (non HMO) | Admitting: Internal Medicine

## 2020-02-21 ENCOUNTER — Other Ambulatory Visit: Payer: Self-pay

## 2020-02-21 ENCOUNTER — Encounter: Payer: Self-pay | Admitting: Internal Medicine

## 2020-02-21 VITALS — BP 102/80 | HR 92 | Temp 98.4°F | Ht 64.0 in | Wt 137.0 lb

## 2020-02-21 DIAGNOSIS — S0093XD Contusion of unspecified part of head, subsequent encounter: Secondary | ICD-10-CM | POA: Diagnosis not present

## 2020-02-21 DIAGNOSIS — R42 Dizziness and giddiness: Secondary | ICD-10-CM

## 2020-02-21 DIAGNOSIS — S0093XA Contusion of unspecified part of head, initial encounter: Secondary | ICD-10-CM | POA: Insufficient documentation

## 2020-02-21 NOTE — Progress Notes (Signed)
Date:  02/21/2020   Name:  Wendy Mercer   DOB:  [birth year]   MRN:  [id number]   Chief Complaint: Cyst (follow up- right side of head- still there/ may have shifted up, wants to know if she can get another CT scan hasnt had one in 6 years, x2 days ago throbbing on right side, x2-3 weeks has been dizzy )  HPI  Mass on side of head - unchanged but has some mild transient headache in that area last week and was concerned.  No HA now.  No vision changes.  No sinus sx. Fleeting sx of vertigo that do not last.   CT 05/2014: IMPRESSION:  Benign appearing ossification to the right temporal bone the likely  represents ossification of a remote hematoma. It is not  significantly changed.  Normal CT appearance of the brain.     MRI 01/2011: The focal area of signal loss within the right parietal skull  likely represents a small osteoma or calcification from previous  trauma.  IMPRESSION:  1. 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  2. Normal appearance of the brain otherwise.  Lab Results  Component Value Date   CREATININE 0.99 07/14/2018   BUN 10 07/14/2018   NA 142 07/14/2018   K 4.3 07/14/2018   CL 103 07/14/2018   CO2 23 07/14/2018   Lab Results  Component Value Date   CHOL 147 07/14/2018   HDL 82 07/14/2018   LDLCALC 47 07/14/2018   TRIG 92 07/14/2018   CHOLHDL 1.8 07/14/2018   No results found for: TSH No results found for: HGBA1C Lab Results  Component Value Date   WBC 4.4 09/04/2016   HGB 13.3 09/04/2016   HCT 39.4 09/04/2016   MCV 89 09/04/2016   PLT 285 09/04/2016   Lab Results  Component Value Date   ALT 10 07/14/2018   AST 17 07/14/2018   ALKPHOS 52 07/14/2018   BILITOT 0.5 07/14/2018    Review of Systems  Constitutional: Negative for chills, fatigue and fever.  HENT: Negative for congestion, postnasal drip, sinus pressure,  sinus pain and trouble swallowing.   Eyes: Negative for visual disturbance.  Respiratory: Negative for shortness of breath and wheezing.   Cardiovascular: Negative for chest pain and palpitations.  Neurological: Positive for dizziness (fleeting sx). Negative for tremors, speech difficulty, weakness and headaches.  Psychiatric/Behavioral: Negative for sleep disturbance. The patient is not nervous/anxious.     Patient Active Problem List   Diagnosis Date Noted  . Scalp cyst 07/14/2018  . Varicose veins of both lower extremities with pain 04/09/2016  . Hyperprolactinemia (HCC) 08/29/2015  . Herpes simplex infection 08/29/2015  . Acne 08/29/2015  . 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 EYE SURGERY      lasik  . LAPAROSCOPIC TUBAL LIGATION Bilateral 09/22/2013   Procedure: BILATERAL LAPAROSCOPIC TUBAL LIGATION;  Surgeon: Loney Laurence, MD;  Location: [facility];  Service: Gynecology;  Laterality: Bilateral;  . TUBAL LIGATION Bilateral 07/28/2013   Procedure: HYSTEROSCOPY/ ATTEMPTED ESSURE TUBAL STERILIZATION;  Surgeon: Loney Laurence, MD;  Location: [facility];  Service: Gynecology;  Laterality: Bilateral;    Social History   Tobacco Use  . Smoking status: Never Smoker  . Smokeless tobacco: Never Used  Vaping Use  . Vaping Use:  Never used  Substance Use Topics  . Alcohol use: No    Alcohol/week: 2.0 standard drinks    Types: 2 Standard drinks or equivalent per week  . Drug use: No     Medication list has been reviewed and updated.  Current Meds  Medication Sig  . doxycycline (VIBRAMYCIN) 100 MG capsule Take 100 mg by mouth daily.  . drospirenone-ethinyl estradiol (YAZ,GIANVI,LORYNA) 3-0.02 MG tablet Take 1 tablet by mouth daily.  Marland Kitchen ibuprofen (ADVIL,MOTRIN) 200 MG tablet Take 200 mg by mouth every 6 (six) hours as needed.  Marland Kitchen spironolactone (ALDACTONE) 100 MG tablet Take 100 mg by  mouth daily.    PHQ 2/9 Scores 02/21/2020 07/14/2018  PHQ - 2 Score 0 0  PHQ- 9 Score 0 0    GAD 7 : Generalized Anxiety Score 02/21/2020  Nervous, Anxious, on Edge 0  Control/stop worrying 0  Worry too much - different things 0  Trouble relaxing 0  Restless 0  Easily annoyed or irritable 0  Afraid - awful might happen 0  Total GAD 7 Score 0  Anxiety Difficulty Not difficult at all    BP Readings from Last 3 Encounters:  02/21/20 102/80  07/14/18 108/60  09/01/17 114/76    Physical Exam Vitals and nursing note reviewed.  Constitutional:      General: She is not in acute distress.    Appearance: Normal appearance. She is well-developed.  HENT:     Head: Normocephalic and atraumatic.      Comments: Smooth bony raised mass seen on CT and MRI previously    Right Ear: Tympanic membrane and ear canal normal.     Left Ear: Tympanic membrane and ear canal normal.     Nose:     Right Sinus: No maxillary sinus tenderness or frontal sinus tenderness.     Left Sinus: No maxillary sinus tenderness or frontal sinus tenderness.  Cardiovascular:     Rate and Rhythm: Normal rate and regular rhythm.     Pulses: Normal pulses.     Heart sounds: No murmur heard.   Pulmonary:     Effort: Pulmonary effort is normal. No respiratory distress.     Breath sounds: No wheezing or rhonchi.  Musculoskeletal:        General: Normal range of motion.     Cervical back: Normal range of motion.  Lymphadenopathy:     Cervical: No cervical adenopathy.  Skin:    General: Skin is warm and dry.     Findings: No rash.  Neurological:     Mental Status: She is alert and oriented to person, place, and time.  Psychiatric:        Attention and Perception: Attention normal.        Behavior: Behavior normal.        Thought Content: Thought content normal.     Wt Readings from Last 3 Encounters:  02/21/20 137 lb (62.1 kg)  07/14/18 134 lb (60.8 kg)  04/09/16 135 lb (61.2 kg)    BP 102/80   Pulse  92   Temp 98.4 F (36.9 C) (Oral)   Ht 5\' 4"  (1.626 m)   Wt 137 lb (62.1 kg)   SpO2 99%   BMI 23.52 kg/m   Assessment and Plan: 1. Calcified hematoma of head, subsequent encounter Patient is reassured Previous MRI and CT reviewed and questions answered  2. Vertigo Mild intermittent sx - likely episodic Maintain good hydration and follow up or call if worsening   Partially  dictated using Animal nutritionist. Any errors are unintentional.  Bari Edward, MD [facility]  02/21/2020

## 2021-10-04 ENCOUNTER — Ambulatory Visit (LOCAL_COMMUNITY_HEALTH_CENTER): Payer: Self-pay

## 2021-10-04 DIAGNOSIS — Z23 Encounter for immunization: Secondary | ICD-10-CM

## 2021-10-04 DIAGNOSIS — Z719 Counseling, unspecified: Secondary | ICD-10-CM

## 2021-10-04 NOTE — Progress Notes (Signed)
?  Are you feeling sick today? No ? ? ?Have you ever received a dose of COVID-19 Vaccine? [employer], [place], [place], [employer], Other) Yes ? ?If yes, which vaccine and how many doses?   Pfizer 2; Moderna 1 ? ? ?Did you bring the vaccination record card or other documentation?  Yes ? ? ?Do you have a health condition or are undergoing treatment that makes you moderately or severely immunocompromised? This would include, but not be limited to: cancer, HIV, organ transplant, immunosuppressive therapy/high-dose corticosteroids, or moderate/severe primary immunodeficiency.  No ? ?Have you received COVID-19 vaccine before or during hematopoietic cell transplant (HCT) or CAR-T-cell therapies? No ? ?Have you ever had an allergic reaction to: (This would include a severe allergic reaction or a reaction that caused hives, swelling, or respiratory distress, including wheezing.) A component of a COVID-19 vaccine or a previous dose of COVID-19 vaccine? No ? ? ?Have you ever had an allergic reaction to another vaccine (other thanCOVID-19 vaccine) or an injectable medication? (This would include a severe allergic reaction or a reaction that caused hives, swelling, or respiratory distress, including wheezing.)   No ?  ?Do you have a history of any of the following: ? ?Myocarditis or Pericarditis No ?Thrombosis with thrombocytopenia syndrome (TTS) No ?Multisystem Inflammatory Syndrome (MIS-C or MIS-A)? No ?Immune-mediate syndrome defined by thrombosis and thrombocytopenia, such as heparin--induced thrombocytopenia (HIT)  No ?Guillain-Barr? Syndrome (GBS) No ?COVID-19 disease within the past 3 months? No ?Vaccinated with monkeypox vaccine in the last 4 weeks? No ? ?Presents to nurse clinic requesting Pfizer BV booster dose.  Administered right deltoid;tolerated well. Declined VIS. Post vaccine instructions given. ?Cherlynn Polo, RN  ?

## 2022-09-16 ENCOUNTER — Other Ambulatory Visit: Payer: Self-pay

## 2022-09-16 DIAGNOSIS — Z1231 Encounter for screening mammogram for malignant neoplasm of breast: Secondary | ICD-10-CM

## 2023-12-10 LAB — HM MAMMOGRAPHY
